# Patient Record
Sex: Female | Born: 1935 | Race: White | Hispanic: No | Smoking: Never smoker
Health system: Southern US, Community
[De-identification: ages and names within clinical notes are randomized; demographics above are authoritative.]

## PROBLEM LIST (undated history)

## (undated) DIAGNOSIS — M1990 Unspecified osteoarthritis, unspecified site: Secondary | ICD-10-CM

## (undated) DIAGNOSIS — D126 Benign neoplasm of colon, unspecified: Secondary | ICD-10-CM

## (undated) DIAGNOSIS — C4491 Basal cell carcinoma of skin, unspecified: Secondary | ICD-10-CM

## (undated) DIAGNOSIS — T7840XA Allergy, unspecified, initial encounter: Secondary | ICD-10-CM

## (undated) DIAGNOSIS — E785 Hyperlipidemia, unspecified: Secondary | ICD-10-CM

## (undated) HISTORY — DX: Unspecified osteoarthritis, unspecified site: M19.90

## (undated) HISTORY — PX: APPENDECTOMY: SHX54

## (undated) HISTORY — DX: Hyperlipidemia, unspecified: E78.5

## (undated) HISTORY — DX: Allergy, unspecified, initial encounter: T78.40XA

## (undated) HISTORY — DX: Basal cell carcinoma of skin, unspecified: C44.91

## (undated) HISTORY — DX: Benign neoplasm of colon, unspecified: D12.6

---

## 2005-06-28 ENCOUNTER — Ambulatory Visit: Payer: Self-pay | Admitting: Internal Medicine

## 2005-07-09 ENCOUNTER — Ambulatory Visit: Payer: Self-pay | Admitting: Internal Medicine

## 2005-08-01 ENCOUNTER — Encounter: Admission: RE | Admit: 2005-08-01 | Discharge: 2005-08-01 | Payer: Self-pay | Admitting: Internal Medicine

## 2005-09-25 ENCOUNTER — Ambulatory Visit: Payer: Self-pay | Admitting: Internal Medicine

## 2006-07-04 ENCOUNTER — Other Ambulatory Visit: Admission: RE | Admit: 2006-07-04 | Discharge: 2006-07-04 | Payer: Self-pay | Admitting: Internal Medicine

## 2006-07-04 ENCOUNTER — Ambulatory Visit: Payer: Self-pay | Admitting: Internal Medicine

## 2006-07-04 ENCOUNTER — Encounter: Payer: Self-pay | Admitting: Internal Medicine

## 2006-08-04 ENCOUNTER — Encounter: Admission: RE | Admit: 2006-08-04 | Discharge: 2006-08-04 | Payer: Self-pay | Admitting: Internal Medicine

## 2006-08-14 ENCOUNTER — Ambulatory Visit: Payer: Self-pay | Admitting: Gastroenterology

## 2006-08-28 ENCOUNTER — Encounter (INDEPENDENT_AMBULATORY_CARE_PROVIDER_SITE_OTHER): Payer: Self-pay | Admitting: Specialist

## 2006-08-28 ENCOUNTER — Ambulatory Visit: Payer: Self-pay | Admitting: Gastroenterology

## 2006-08-28 LAB — HM COLONOSCOPY

## 2006-09-17 ENCOUNTER — Ambulatory Visit: Payer: Self-pay | Admitting: Internal Medicine

## 2007-04-12 LAB — CONVERTED CEMR LAB: Pap Smear: NORMAL

## 2007-07-21 DIAGNOSIS — Z860100 Personal history of colon polyps, unspecified: Secondary | ICD-10-CM | POA: Insufficient documentation

## 2007-07-21 DIAGNOSIS — E785 Hyperlipidemia, unspecified: Secondary | ICD-10-CM | POA: Insufficient documentation

## 2007-08-06 ENCOUNTER — Encounter: Admission: RE | Admit: 2007-08-06 | Discharge: 2007-08-06 | Payer: Self-pay | Admitting: Internal Medicine

## 2007-08-19 ENCOUNTER — Ambulatory Visit: Payer: Self-pay | Admitting: Internal Medicine

## 2007-08-21 LAB — CONVERTED CEMR LAB
ALT: 22 units/L (ref 0–35)
AST: 24 units/L (ref 0–37)
Albumin: 4.1 g/dL (ref 3.5–5.2)
Alkaline Phosphatase: 79 units/L (ref 39–117)
BUN: 19 mg/dL (ref 6–23)
Basophils Absolute: 0 10*3/uL (ref 0.0–0.1)
Basophils Relative: 0.5 % (ref 0.0–1.0)
Bilirubin, Direct: 0.1 mg/dL (ref 0.0–0.3)
CO2: 33 meq/L — ABNORMAL HIGH (ref 19–32)
Calcium: 9.8 mg/dL (ref 8.4–10.5)
Chloride: 107 meq/L (ref 96–112)
Cholesterol: 208 mg/dL (ref 0–200)
Creatinine, Ser: 0.7 mg/dL (ref 0.4–1.2)
Direct LDL: 139.1 mg/dL
Eosinophils Absolute: 0.3 10*3/uL (ref 0.0–0.6)
Eosinophils Relative: 6.2 % — ABNORMAL HIGH (ref 0.0–5.0)
GFR calc Af Amer: 107 mL/min
GFR calc non Af Amer: 88 mL/min
Glucose, Bld: 112 mg/dL — ABNORMAL HIGH (ref 70–99)
HCT: 41.4 % (ref 36.0–46.0)
HDL: 48.2 mg/dL (ref >39.0)
Hemoglobin: 14 g/dL (ref 12.0–15.0)
Lymphocytes Relative: 45 % (ref 12.0–46.0)
MCHC: 33.8 g/dL (ref 30.0–36.0)
MCV: 90.7 fL (ref 78.0–100.0)
Monocytes Absolute: 0.5 10*3/uL (ref 0.2–0.7)
Monocytes Relative: 8.7 % (ref 3.0–11.0)
Neutro Abs: 2.2 10*3/uL (ref 1.4–7.7)
Neutrophils Relative %: 39.6 % — ABNORMAL LOW (ref 43.0–77.0)
Platelets: 260 10*3/uL (ref 150–400)
Potassium: 5.3 meq/L — ABNORMAL HIGH (ref 3.5–5.1)
RBC: 4.57 M/uL (ref 3.87–5.11)
RDW: 12.7 % (ref 11.5–14.6)
Sodium: 145 meq/L (ref 135–145)
TSH: 2.61 microintl units/mL (ref 0.35–5.50)
Total Bilirubin: 0.8 mg/dL (ref 0.3–1.2)
Total CHOL/HDL Ratio: 4.3
Total Protein: 7.6 g/dL (ref 6.0–8.3)
Triglycerides: 145 mg/dL (ref 0–149)
VLDL: 29 mg/dL (ref 0–40)
WBC: 5.5 10*3/uL (ref 4.5–10.5)

## 2007-10-15 ENCOUNTER — Telehealth: Payer: Self-pay | Admitting: Internal Medicine

## 2007-10-15 ENCOUNTER — Ambulatory Visit: Payer: Self-pay | Admitting: Internal Medicine

## 2007-12-14 ENCOUNTER — Ambulatory Visit: Payer: Self-pay | Admitting: Internal Medicine

## 2007-12-14 LAB — CONVERTED CEMR LAB
BUN: 21 mg/dL (ref 6–23)
CO2: 31 meq/L (ref 19–32)
GFR calc Af Amer: 107 mL/min
Hgb A1c MFr Bld: 6.2 % — ABNORMAL HIGH (ref 4.6–6.0)
Potassium: 4.6 meq/L (ref 3.5–5.1)
Total CHOL/HDL Ratio: 4
Triglycerides: 70 mg/dL (ref 0–149)
VLDL: 14 mg/dL (ref 0–40)

## 2007-12-21 ENCOUNTER — Ambulatory Visit: Payer: Self-pay | Admitting: Internal Medicine

## 2007-12-21 DIAGNOSIS — R7309 Other abnormal glucose: Secondary | ICD-10-CM | POA: Insufficient documentation

## 2007-12-28 ENCOUNTER — Encounter: Payer: Self-pay | Admitting: Internal Medicine

## 2007-12-28 ENCOUNTER — Ambulatory Visit: Payer: Self-pay | Admitting: Family Medicine

## 2008-05-18 ENCOUNTER — Ambulatory Visit: Payer: Self-pay | Admitting: Internal Medicine

## 2008-05-18 ENCOUNTER — Telehealth: Payer: Self-pay | Admitting: Internal Medicine

## 2008-05-18 LAB — CONVERTED CEMR LAB
Nitrite: POSITIVE
Specific Gravity, Urine: 1.02
Urobilinogen, UA: 1

## 2008-06-13 ENCOUNTER — Ambulatory Visit: Payer: Self-pay | Admitting: Internal Medicine

## 2008-06-13 LAB — CONVERTED CEMR LAB
ALT: 18 units/L (ref 0–35)
AST: 24 units/L (ref 0–37)
BUN: 24 mg/dL — ABNORMAL HIGH (ref 6–23)
Bilirubin, Direct: 0.1 mg/dL (ref 0.0–0.3)
CO2: 34 meq/L — ABNORMAL HIGH (ref 19–32)
Calcium: 9.5 mg/dL (ref 8.4–10.5)
GFR calc Af Amer: 107 mL/min
GFR calc non Af Amer: 88 mL/min
HDL: 40.7 mg/dL (ref >39.0)
LDL Cholesterol: 95 mg/dL (ref 0–99)
Sodium: 143 meq/L (ref 135–145)
Total Bilirubin: 0.8 mg/dL (ref 0.3–1.2)
Total CHOL/HDL Ratio: 3.7
Triglycerides: 74 mg/dL (ref 0–149)

## 2008-06-16 ENCOUNTER — Ambulatory Visit: Payer: Self-pay | Admitting: Internal Medicine

## 2008-08-08 ENCOUNTER — Encounter: Admission: RE | Admit: 2008-08-08 | Discharge: 2008-08-08 | Payer: Self-pay | Admitting: Internal Medicine

## 2008-08-12 ENCOUNTER — Ambulatory Visit: Payer: Self-pay | Admitting: Internal Medicine

## 2008-12-21 ENCOUNTER — Ambulatory Visit: Payer: Self-pay | Admitting: Internal Medicine

## 2008-12-21 LAB — CONVERTED CEMR LAB
Albumin: 3.9 g/dL (ref 3.5–5.2)
BUN: 21 mg/dL (ref 6–23)
Calcium: 9.3 mg/dL (ref 8.4–10.5)
Cholesterol: 148 mg/dL (ref 0–200)
Creatinine, Ser: 0.6 mg/dL (ref 0.4–1.2)
GFR calc Af Amer: 127 mL/min
GFR calc non Af Amer: 105 mL/min
Glucose, Bld: 96 mg/dL (ref 70–99)
HDL: 45.7 mg/dL (ref >39.0)
Hgb A1c MFr Bld: 6.2 % — ABNORMAL HIGH (ref 4.6–6.0)
LDL Cholesterol: 93 mg/dL (ref 0–99)
Total Protein: 7.5 g/dL (ref 6.0–8.3)
Triglycerides: 49 mg/dL (ref 0–149)
VLDL: 10 mg/dL (ref 0–40)

## 2008-12-28 ENCOUNTER — Ambulatory Visit: Payer: Self-pay | Admitting: Internal Medicine

## 2009-06-16 ENCOUNTER — Ambulatory Visit: Payer: Self-pay | Admitting: Family Medicine

## 2009-06-16 LAB — CONVERTED CEMR LAB
Bilirubin Urine: NEGATIVE
Ketones, urine, test strip: NEGATIVE
Specific Gravity, Urine: 1.02

## 2009-06-17 ENCOUNTER — Encounter: Payer: Self-pay | Admitting: Internal Medicine

## 2009-06-27 ENCOUNTER — Ambulatory Visit: Payer: Self-pay | Admitting: Internal Medicine

## 2009-06-27 LAB — CONVERTED CEMR LAB
AST: 19 units/L (ref 0–37)
Albumin: 3.9 g/dL (ref 3.5–5.2)
Alkaline Phosphatase: 62 units/L (ref 39–117)
BUN: 24 mg/dL — ABNORMAL HIGH (ref 6–23)
Bilirubin, Direct: 0 mg/dL (ref 0.0–0.3)
CO2: 34 meq/L — ABNORMAL HIGH (ref 19–32)
Calcium: 9.2 mg/dL (ref 8.4–10.5)
Chloride: 107 meq/L (ref 96–112)
Cholesterol: 149 mg/dL (ref 0–200)
Creatinine, Ser: 0.7 mg/dL (ref 0.4–1.2)
LDL Cholesterol: 87 mg/dL (ref 0–99)
Total CHOL/HDL Ratio: 3
Triglycerides: 69 mg/dL (ref 0.0–149.0)

## 2009-07-04 ENCOUNTER — Ambulatory Visit: Payer: Self-pay | Admitting: Internal Medicine

## 2009-08-11 ENCOUNTER — Encounter: Admission: RE | Admit: 2009-08-11 | Discharge: 2009-08-11 | Payer: Self-pay | Admitting: Internal Medicine

## 2009-08-17 ENCOUNTER — Ambulatory Visit: Payer: Self-pay | Admitting: Internal Medicine

## 2010-01-09 ENCOUNTER — Ambulatory Visit: Payer: Self-pay | Admitting: Internal Medicine

## 2010-01-09 LAB — CONVERTED CEMR LAB
ALT: 15 units/L (ref 0–35)
AST: 22 units/L (ref 0–37)
Alkaline Phosphatase: 57 units/L (ref 39–117)
Bilirubin, Direct: 0.1 mg/dL (ref 0.0–0.3)
Chloride: 109 meq/L (ref 96–112)
Cholesterol: 153 mg/dL (ref 0–200)
GFR calc non Af Amer: 87.54 mL/min (ref >60)
Hgb A1c MFr Bld: 6.3 % (ref 4.6–6.5)
LDL Cholesterol: 85 mg/dL (ref 0–99)
Potassium: 5.5 meq/L — ABNORMAL HIGH (ref 3.5–5.1)
Sodium: 144 meq/L (ref 135–145)
Total Bilirubin: 0.6 mg/dL (ref 0.3–1.2)
Total CHOL/HDL Ratio: 3
VLDL: 9.8 mg/dL (ref 0.0–40.0)

## 2010-01-18 ENCOUNTER — Ambulatory Visit: Payer: Self-pay | Admitting: Internal Medicine

## 2010-03-13 ENCOUNTER — Ambulatory Visit: Payer: Self-pay | Admitting: Internal Medicine

## 2010-07-18 ENCOUNTER — Ambulatory Visit: Payer: Self-pay | Admitting: Internal Medicine

## 2010-07-18 LAB — CONVERTED CEMR LAB
ALT: 14 units/L (ref 0–35)
Bilirubin, Direct: 0.1 mg/dL (ref 0.0–0.3)
Chloride: 105 meq/L (ref 96–112)
GFR calc non Af Amer: 117.94 mL/min (ref >60)
Glucose, Bld: 90 mg/dL (ref 70–99)
HDL: 52.7 mg/dL (ref >39.00)
LDL Cholesterol: 109 mg/dL — ABNORMAL HIGH (ref 0–99)
Potassium: 4.2 meq/L (ref 3.5–5.1)
Sodium: 143 meq/L (ref 135–145)
Total Bilirubin: 0.6 mg/dL (ref 0.3–1.2)
Total CHOL/HDL Ratio: 3
VLDL: 13.8 mg/dL (ref 0.0–40.0)

## 2010-07-23 ENCOUNTER — Ambulatory Visit: Payer: Self-pay | Admitting: Internal Medicine

## 2010-08-14 ENCOUNTER — Encounter: Admission: RE | Admit: 2010-08-14 | Discharge: 2010-08-14 | Payer: Self-pay | Admitting: Internal Medicine

## 2010-08-14 LAB — HM MAMMOGRAPHY

## 2010-11-06 ENCOUNTER — Telehealth: Payer: Self-pay | Admitting: Internal Medicine

## 2010-12-11 NOTE — Miscellaneous (Signed)
Summary: BONE DENSITY  Clinical Lists Changes  Orders: Added new Test order of T-Bone Densitometry (77080) - Signed Added new Test order of T-Lumbar Vertebral Assessment (77082) - Signed 

## 2010-12-11 NOTE — Assessment & Plan Note (Signed)
Summary: 6 MNTH ROV//SLM/PT RESCD FROM BUMP//CCM   Vital Signs:  Patient profile:   75 year old female Weight:      144 pounds Temp:     98.2 degrees F oral Pulse rate:   62 / minute Resp:     16 per minute BP sitting:   166 / 60  Vitals Entered By: Lynann Beaver CMA (January 18, 2010 3:00 PM)  Serial Vital Signs/Assessments:  Time      Position  BP       Pulse  Resp  Temp     By                     138/82                         Birdie Sons MD  CC: rov Is Patient Diabetic? No Pain Assessment Patient in pain? no        CC:  rov.  History of Present Illness: ? bp home BPS 120s/60s no sxs tolerating meds without difficulty  lipids --- here for f/u, tolerating meds, no complications  All other systems reviewed and were negative     Current Medications (verified): 1)  Lipitor 20 Mg Tabs (Atorvastatin Calcium) .... Once Daily 2)  Caltrate 600+d 600-400 Mg-Unit  Tabs (Calcium Carbonate-Vitamin D) .... Two Times A Day 3)  Aspirin 81 Mg  Tbec (Aspirin) .... Once Daily  Allergies (verified): No Known Drug Allergies  Past History:  Past Medical History: Last updated: 07/21/2007 Colonic polyps, hx of Hyperlipidemia Allergies High Cholesterol UTIs  Past Surgical History: Last updated: 07/21/2007 Appendectomy  Family History: Last updated: 12/28/2008 Family History Diabetes 1st degree relative-father Family History of Cardiovascular disorder Father MI at age 44 mother-throat CA  Social History: Last updated: 07/21/2007 Occupation: Single Never Smoked Alcohol use-no Drug use-no Regular exercise-yes  Risk Factors: Exercise: yes (07/21/2007)  Risk Factors: Smoking Status: never (07/21/2007)  Physical Exam  General:  Well-developed,well-nourished,in no acute distress; alert,appropriate and cooperative throughout examination Head:  normocephalic and atraumatic.   Neck:  No deformities, masses, or tenderness noted. Chest Wall:  No deformities,  masses, or tenderness noted. Lungs:  Normal respiratory effort, chest expands symmetrically. Lungs are clear to auscultation, no crackles or wheezes. Heart:  Normal rate and regular rhythm. S1 and S2 normal without gallop, murmur, click, rub or other extra sounds. Abdomen:  soft and non-tender.     Impression & Recommendations:  Problem # 1:  HYPERLIPIDEMIA (ICD-272.4) controlled continue current medications  Her updated medication list for this problem includes:    Lipitor 20 Mg Tabs (Atorvastatin calcium) ..... Once daily  Labs Reviewed: SGOT: 22 (01/09/2010)   SGPT: 15 (01/09/2010)   HDL:58.00 (01/09/2010), 48.40 (06/27/2009)  LDL:85 (01/09/2010), 87 (06/27/2009)  Chol:153 (01/09/2010), 149 (06/27/2009)  Trig:49.0 (01/09/2010), 69.0 (06/27/2009) she will monitor bp at home see serial assessment  Complete Medication List: 1)  Lipitor 20 Mg Tabs (Atorvastatin calcium) .... Once daily 2)  Caltrate 600+d 600-400 Mg-unit Tabs (Calcium carbonate-vitamin d) .... Two times a day 3)  Aspirin 81 Mg Tbec (Aspirin) .... Once daily  Patient Instructions: 1)  Please schedule a follow-up appointment in 6 months. 2)  labs one week prior to visit 3)  lipids---272.4 4)  lfts-995.2 5)  bmet-995.2 6)  A1C-250.02 7)

## 2010-12-11 NOTE — Assessment & Plan Note (Signed)
Summary: 6 MTH ROV, F/U ON LABWORK // RS   Vital Signs:  Patient profile:   75 year old female Height:      63.5 inches Weight:      144 pounds BMI:     25.20 Temp:     98.4 degrees F oral BP sitting:   120 / 80  (left arm) Cuff size:   regular  Vitals Entered By: Kern Reap CMA Duncan Dull) (July 23, 2010 10:18 AM) CC: follow-up visit   CC:  follow-up visit.  History of Present Illness:  Follow-Up Visit      This is a 75 year old woman who presents for Follow-up visit.  The patient denies chest pain and palpitations.  Since the last visit the patient notes no new problems or concerns.  The patient reports taking meds as prescribed.  When questioned about possible medication side effects, the patient notes none.    All other systems reviewed and were negative   Current Problems (verified): 1)  Hyperglycemia  (ICD-790.29) 2)  Preventive Health Care  (ICD-V70.0) 3)  Family History Diabetes 1st Degree Relative  (ICD-V18.0) 4)  Hyperlipidemia  (ICD-272.4) 5)  Colonic Polyps, Hx of  (ICD-V12.72)  Current Medications (verified): 1)  Lipitor 20 Mg Tabs (Atorvastatin Calcium) .... Once Daily 2)  Caltrate 600+d 600-400 Mg-Unit  Tabs (Calcium Carbonate-Vitamin D) .... Two Times A Day 3)  Aspirin 81 Mg  Tbec (Aspirin) .... Once Daily  Allergies (verified): No Known Drug Allergies  Review of Systems       Flu Vaccine Consent Questions     Do you have a history of severe allergic reactions to this vaccine? no    Any prior history of allergic reactions to egg and/or gelatin? no    Do you have a sensitivity to the preservative Thimersol? no    Do you have a past history of Guillan-Barre Syndrome? no    Do you currently have an acute febrile illness? no    Have you ever had a severe reaction to latex? no    Vaccine information given and explained to patient? yes    Are you currently pregnant? no    Lot Number:[id number]   Exp Date:05/11/2011   Site Given  Left Deltoid  IM   Physical Exam  General:  alert and well-developed.   Head:  normocephalic and atraumatic.   Neck:  No deformities, masses, or tenderness noted. Heart:  normal rate and regular rhythm.   Abdomen:  soft and non-tender.   Skin:  turgor normal and color normal.   Psych:  normally interactive and good eye contact.     Impression & Recommendations:  Problem # 1:  HYPERGLYCEMIA (ICD-790.29) reviewed labs encouraged her to exercise daily and eat a low fat low calorie diet In my opinion she is at high risk of developing DM if she were to gain any weight  Problem # 2:  HYPERLIPIDEMIA (ICD-272.4)  Her updated medication list for this problem includes:    Lipitor 20 Mg Tabs (Atorvastatin calcium) ..... Once daily  Labs Reviewed: SGOT: 22 (07/18/2010)   SGPT: 14 (07/18/2010)   HDL:52.70 (07/18/2010), 58.00 (01/09/2010)  LDL:109 (07/18/2010), 85 (04/54/0981)  Chol:175 (07/18/2010), 153 (01/09/2010)  Trig:69.0 (07/18/2010), 49.0 (01/09/2010)  Complete Medication List: 1)  Lipitor 20 Mg Tabs (Atorvastatin calcium) .... Once daily 2)  Caltrate 600+d 600-400 Mg-unit Tabs (Calcium carbonate-vitamin d) .... Two times a day 3)  Aspirin 81 Mg Tbec (Aspirin) .... Once daily  Other Orders: Admin 1st  Vaccine (98119) Flu Vaccine 66yrs + (14782)  Patient Instructions: 1)  Please schedule a follow-up appointment in 6 months. 2)  labs one week prior to visit 3)  lipids---272.4 4)  lfts-995.2 5)  bmet-995.2 6)  A1C-250.02 7)

## 2010-12-13 NOTE — Progress Notes (Signed)
Summary: Pt req to change to generic Lipitor instead of brand.   Phone Note Call from Patient Call back at [facility] Phone [phone]   Caller: Patient Summary of Call: Pt is req to have Lipitor 20mg   changed to generic. Pls call generic in to [facility] on [address]    Initial call taken by: Lucy Antigua,  November 06, 2010 1:54 PM  Follow-up for Phone Call        l/m on machine to ask the pharmacist to refill generic.  If she has any problems to call me back Follow-up by: Alfred Levins, CMA,  November 06, 2010 2:04 PM

## 2011-01-14 ENCOUNTER — Other Ambulatory Visit (INDEPENDENT_AMBULATORY_CARE_PROVIDER_SITE_OTHER): Payer: Medicare Other | Admitting: Internal Medicine

## 2011-01-14 DIAGNOSIS — E785 Hyperlipidemia, unspecified: Secondary | ICD-10-CM

## 2011-01-14 DIAGNOSIS — T887XXA Unspecified adverse effect of drug or medicament, initial encounter: Secondary | ICD-10-CM

## 2011-01-14 DIAGNOSIS — I10 Essential (primary) hypertension: Secondary | ICD-10-CM

## 2011-01-14 DIAGNOSIS — E119 Type 2 diabetes mellitus without complications: Secondary | ICD-10-CM

## 2011-01-14 LAB — HEPATIC FUNCTION PANEL
ALT: 14 U/L (ref 0–35)
AST: 21 U/L (ref 0–37)
Bilirubin, Direct: 0.1 mg/dL (ref 0.0–0.3)
Total Bilirubin: 0.4 mg/dL (ref 0.3–1.2)
Total Protein: 7.2 g/dL (ref 6.0–8.3)

## 2011-01-14 LAB — BASIC METABOLIC PANEL
BUN: 21 mg/dL (ref 6–23)
CO2: 29 mEq/L (ref 19–32)
Chloride: 102 mEq/L (ref 96–112)
Potassium: 4.7 mEq/L (ref 3.5–5.1)

## 2011-01-14 LAB — LIPID PANEL
LDL Cholesterol: 91 mg/dL (ref 0–99)
VLDL: 14.4 mg/dL (ref 0.0–40.0)

## 2011-01-14 LAB — HEMOGLOBIN A1C: Hgb A1c MFr Bld: 6.7 % — ABNORMAL HIGH (ref 4.6–6.5)

## 2011-01-21 ENCOUNTER — Ambulatory Visit: Payer: Self-pay | Admitting: Internal Medicine

## 2011-02-04 ENCOUNTER — Ambulatory Visit: Payer: Self-pay | Admitting: Internal Medicine

## 2011-02-08 ENCOUNTER — Encounter: Payer: Self-pay | Admitting: Internal Medicine

## 2011-02-11 ENCOUNTER — Encounter: Payer: Self-pay | Admitting: Internal Medicine

## 2011-02-11 ENCOUNTER — Ambulatory Visit (INDEPENDENT_AMBULATORY_CARE_PROVIDER_SITE_OTHER): Payer: Medicare Other | Admitting: Internal Medicine

## 2011-02-11 DIAGNOSIS — E785 Hyperlipidemia, unspecified: Secondary | ICD-10-CM

## 2011-02-11 DIAGNOSIS — R7309 Other abnormal glucose: Secondary | ICD-10-CM

## 2011-02-11 NOTE — Progress Notes (Signed)
  Subjective:    Patient ID: Gabriela Gomez, female    DOB: 1936/01/21, 75 y.o.   MRN: [id number]  HPI  Pt has a hx of hyperlipidemia---tolerating atorvastatin without difficulty.  Hyperglycemia---may need f/u lab work  New complaint: 4 month hx of intermittent left knee swelling. Minimal pain at times but has not interfered with her very active lifestyle. She walks regularly  Past Medical History  Diagnosis Date  . Hyperlipidemia   . Allergy   . Colon polyps    Past Surgical History  Procedure Date  . Appendectomy     reports that she has never smoked. She does not have any smokeless tobacco history on file. She reports that she does not drink alcohol or use illicit drugs. family history includes Cancer in her mother; Diabetes in her father; Heart attack in her father; and Heart disease in her father. No Known Allergies   Review of Systems  patient denies chest pain, shortness of breath, orthopnea. Denies lower extremity edema, abdominal pain, change in appetite, change in bowel movements. Patient denies rashes, musculoskeletal complaints. No other specific complaints in a complete review of systems.      Objective:   Physical Exam  Well-developed well-nourished female in no acute distress. HEENT exam atraumatic, normocephalic, extraocular muscles are intact. Neck is supple. No jugular venous distention no thyromegaly. Chest clear to auscultation without increased work of breathing. Cardiac exam S1 and S2 are regular.  Extremities no edema. Neurologic exam she is alert without any motor sensory deficits. Gait is normal. She has a small joint effusion on the left knee. No erythema or pain to palpation.        Assessment & Plan:  Knee effusion. I suspect this is osteoarthritis. I don't think any further evaluation is necessary at this time as it's not affecting any of her activities of daily living. She will continue to be active and walk regularly.

## 2011-02-11 NOTE — Assessment & Plan Note (Signed)
Well controlled. Continue current medications  

## 2011-02-11 NOTE — Assessment & Plan Note (Signed)
Note mild elevation A1c compared to previous results. She admits to a more carbohydrate rich diet over the past several months. I've advised her to increase walking, moderate diet. She should follow a low calorie diet. He should concentrate on some weight loss. Family history reviewed. No family history of diabetes. This puts her some increased risk of developing diabetes. Note her blood pressure. I rechecked her blood pressure 135/85. She reports to me that her home blood pressures are more in the range of 120s over 60s. No further treatment at this time.

## 2011-03-03 ENCOUNTER — Ambulatory Visit (INDEPENDENT_AMBULATORY_CARE_PROVIDER_SITE_OTHER): Payer: Medicare Other

## 2011-03-03 ENCOUNTER — Inpatient Hospital Stay (INDEPENDENT_AMBULATORY_CARE_PROVIDER_SITE_OTHER)
Admission: RE | Admit: 2011-03-03 | Discharge: 2011-03-03 | Disposition: A | Discharge status: Home / Self Care | Payer: Medicare Other | Source: Ambulatory Visit | Attending: Family Medicine | Admitting: Family Medicine

## 2011-03-03 DIAGNOSIS — S62639A Displaced fracture of distal phalanx of unspecified finger, initial encounter for closed fracture: Secondary | ICD-10-CM

## 2011-03-03 DIAGNOSIS — T148XXA Other injury of unspecified body region, initial encounter: Secondary | ICD-10-CM

## 2011-06-06 ENCOUNTER — Other Ambulatory Visit: Payer: Medicare Other

## 2011-06-13 ENCOUNTER — Ambulatory Visit: Payer: Medicare Other | Admitting: Internal Medicine

## 2011-07-25 ENCOUNTER — Other Ambulatory Visit (INDEPENDENT_AMBULATORY_CARE_PROVIDER_SITE_OTHER): Payer: Medicare Other

## 2011-07-25 DIAGNOSIS — T887XXA Unspecified adverse effect of drug or medicament, initial encounter: Secondary | ICD-10-CM

## 2011-07-25 DIAGNOSIS — E119 Type 2 diabetes mellitus without complications: Secondary | ICD-10-CM

## 2011-07-25 DIAGNOSIS — E785 Hyperlipidemia, unspecified: Secondary | ICD-10-CM

## 2011-07-25 LAB — HEPATIC FUNCTION PANEL
AST: 24 U/L (ref 0–37)
Albumin: 4 g/dL (ref 3.5–5.2)
Alkaline Phosphatase: 67 U/L (ref 39–117)
Bilirubin, Direct: 0.1 mg/dL (ref 0.0–0.3)
Total Bilirubin: 0.6 mg/dL (ref 0.3–1.2)

## 2011-07-25 LAB — BASIC METABOLIC PANEL
Calcium: 9 mg/dL (ref 8.4–10.5)
Creatinine, Ser: 0.6 mg/dL (ref 0.4–1.2)
GFR: 102.17 mL/min (ref >60.00)

## 2011-07-25 LAB — LIPID PANEL
Total CHOL/HDL Ratio: 3
Triglycerides: 55 mg/dL (ref 0.0–149.0)

## 2011-08-01 ENCOUNTER — Ambulatory Visit (INDEPENDENT_AMBULATORY_CARE_PROVIDER_SITE_OTHER): Payer: Medicare Other | Admitting: Internal Medicine

## 2011-08-01 ENCOUNTER — Encounter: Payer: Self-pay | Admitting: Internal Medicine

## 2011-08-01 ENCOUNTER — Ambulatory Visit (INDEPENDENT_AMBULATORY_CARE_PROVIDER_SITE_OTHER)
Admission: RE | Admit: 2011-08-01 | Discharge: 2011-08-01 | Disposition: A | Discharge status: Home / Self Care | Payer: Medicare Other | Source: Ambulatory Visit | Attending: Internal Medicine | Admitting: Internal Medicine

## 2011-08-01 DIAGNOSIS — M25462 Effusion, left knee: Secondary | ICD-10-CM

## 2011-08-01 DIAGNOSIS — Z23 Encounter for immunization: Secondary | ICD-10-CM

## 2011-08-01 DIAGNOSIS — M25469 Effusion, unspecified knee: Secondary | ICD-10-CM

## 2011-08-01 DIAGNOSIS — R7309 Other abnormal glucose: Secondary | ICD-10-CM

## 2011-08-01 DIAGNOSIS — E785 Hyperlipidemia, unspecified: Secondary | ICD-10-CM

## 2011-08-01 NOTE — Assessment & Plan Note (Signed)
Well controlled Continue meds 

## 2011-08-01 NOTE — Assessment & Plan Note (Signed)
Wt Readings from Last 3 Encounters:  08/01/11 143 lb (64.864 kg)  02/11/11 144 lb (65.318 kg)  07/23/10 144 lb (65.318 kg)    Lab Results  Component Value Date   HGBA1C 6.5 07/25/2011  controlled Will check in 6 months

## 2011-08-01 NOTE — Patient Instructions (Signed)
Ask your insurance company about shingles immunization

## 2011-08-11 NOTE — Progress Notes (Signed)
  Subjective:    Patient ID: Gabriela Gomez, female    DOB: 1936-06-09, 75 y.o.   MRN: [id number]  HPI Patient comes in for evaluation. She has a history of hyperglycemia and hyperlipidemia. She is tolerating medications without difficulty. No specific complaints.  Past Medical History  Diagnosis Date  . Hyperlipidemia   . Allergy   . Colon polyps    Past Surgical History  Procedure Date  . Appendectomy     reports that she has never smoked. She does not have any smokeless tobacco history on file. She reports that she does not drink alcohol or use illicit drugs. family history includes Cancer in her mother; Diabetes in her father; Heart attack in her father; and Heart disease in her father. No Known Allergies    Review of Systems  patient denies chest pain, shortness of breath, orthopnea. Denies lower extremity edema, abdominal pain, change in appetite, change in bowel movements. Patient denies rashes, musculoskeletal complaints. No other specific complaints in a complete review of systems.      Objective:   Physical Exam  Well-developed female in no acute distress. Neck is supple. Chest is clear to auscultation cardiac exam S1-S2 are regular. Extremities no edema      Assessment & Plan:

## 2011-08-15 ENCOUNTER — Other Ambulatory Visit: Payer: Self-pay | Admitting: Internal Medicine

## 2011-08-15 DIAGNOSIS — Z1231 Encounter for screening mammogram for malignant neoplasm of breast: Secondary | ICD-10-CM

## 2011-08-16 ENCOUNTER — Ambulatory Visit (INDEPENDENT_AMBULATORY_CARE_PROVIDER_SITE_OTHER): Payer: Medicare Other

## 2011-08-16 DIAGNOSIS — Z23 Encounter for immunization: Secondary | ICD-10-CM

## 2011-08-30 ENCOUNTER — Ambulatory Visit
Admission: RE | Admit: 2011-08-30 | Discharge: 2011-08-30 | Disposition: A | Discharge status: Home / Self Care | Payer: Medicare Other | Source: Ambulatory Visit | Attending: Internal Medicine | Admitting: Internal Medicine

## 2011-08-30 DIAGNOSIS — Z1231 Encounter for screening mammogram for malignant neoplasm of breast: Secondary | ICD-10-CM

## 2011-09-02 ENCOUNTER — Telehealth: Payer: Self-pay | Admitting: Internal Medicine

## 2011-09-02 NOTE — Telephone Encounter (Signed)
Pt called req to get script for shingles vax so she can get vax done at a pharmacy. Pt is aware that shingles vax is on back order at the present time. Pt has checked will her insurance and it will cover vax.

## 2011-09-03 MED ORDER — ZOSTER VACCINE LIVE 19400 UNT/0.65ML ~~LOC~~ SOLR: 0.6500 mL | Freq: Once | SUBCUTANEOUS | Status: DC

## 2011-09-03 NOTE — Telephone Encounter (Signed)
[facility] rx sent in electronically

## 2011-09-20 ENCOUNTER — Encounter: Payer: Self-pay | Admitting: Gastroenterology

## 2011-10-11 ENCOUNTER — Encounter: Payer: Self-pay | Admitting: Gastroenterology

## 2011-10-21 ENCOUNTER — Other Ambulatory Visit: Payer: Self-pay | Admitting: Internal Medicine

## 2011-11-07 ENCOUNTER — Ambulatory Visit (AMBULATORY_SURGERY_CENTER): Payer: Medicare Other | Admitting: *Deleted

## 2011-11-07 VITALS — Ht 63.0 in | Wt 145.0 lb

## 2011-11-07 DIAGNOSIS — Z1211 Encounter for screening for malignant neoplasm of colon: Secondary | ICD-10-CM

## 2011-11-07 MED ORDER — PEG-KCL-NACL-NASULF-NA ASC-C 100 G PO SOLR: ORAL | Status: DC

## 2011-11-08 ENCOUNTER — Encounter: Payer: Self-pay | Admitting: Gastroenterology

## 2011-11-21 ENCOUNTER — Ambulatory Visit (AMBULATORY_SURGERY_CENTER): Payer: Medicare Other | Admitting: Gastroenterology

## 2011-11-21 ENCOUNTER — Encounter: Payer: Self-pay | Admitting: Gastroenterology

## 2011-11-21 VITALS — BP 137/63 | HR 66 | Temp 95.6°F | Resp 14 | Ht 63.0 in | Wt 145.0 lb

## 2011-11-21 DIAGNOSIS — Z86010 Personal history of colonic polyps: Secondary | ICD-10-CM

## 2011-11-21 DIAGNOSIS — Z1211 Encounter for screening for malignant neoplasm of colon: Secondary | ICD-10-CM

## 2011-11-21 MED ORDER — SODIUM CHLORIDE 0.9 % IV SOLN: 500.0000 mL | INTRAVENOUS | Status: DC

## 2011-11-21 NOTE — Op Note (Signed)
[facility] [address] [place], [place]  [address]  COLONOSCOPY PROCEDURE REPORT  PATIENT:  Gabriela Gomez, Gabriela Gomez  MR#:  [id number] BIRTHDATE:  1936-06-04, 75 yrs. old  GENDER:  female ENDOSCOPIST:  Judie Petit T. Russella Dar, MD, [facility]  PROCEDURE DATE:  11/21/2011 PROCEDURE:  Colonoscopy 19147 ASA CLASS:  Class II INDICATIONS:  1) surveillance and high-risk screening  2) history of pre-cancerous (adenomatous) colon polyps MEDICATIONS:   These medications were titrated to patient response per physician's verbal order, Fentanyl 100 mcg IV, Versed 10 mg IV DESCRIPTION OF PROCEDURE:   After the risks benefits and alternatives of the procedure were thoroughly explained, informed consent was obtained.  Digital rectal exam was performed and revealed no abnormalities.   The LB 180AL [id number] endoscope was introduced through the anus and advanced to the cecum, which was identified by both the appendix and ileocecal valve, limited by a tortuous colon.    The quality of the prep was excellent, using MoviPrep.  The instrument was then slowly withdrawn as the colon was fully examined. <<PROCEDUREIMAGES>> FINDINGS:  Mild diverticulosis was found in the sigmoid colon. Otherwise normal colonoscopy without other polyps, masses, vascular ectasias, or inflammatory changes.   Retroflexed views in the rectum revealed no abnormalities.  The time to cecum =  9.75 minutes. The scope was then withdrawn (time =  10.5  min) from the patient and the procedure completed.  COMPLICATIONS:  None  ENDOSCOPIC IMPRESSION: 1) Mild diverticulosis in the sigmoid colon  RECOMMENDATIONS: 1) High fiber diet with liberal fluid intake. 2) Given no precancerous polyps on the last 3 colonoscopies and your age, you will not need another colonoscopy for colon cancer screening/polyp surveillance. These types of tests usually stop around the age 30. 3) Consider MAC sedation for future procedures  Giovani Neumeister T. Russella Dar, MD,  Clementeen Graham  n. eSIGNED:   Venita Lick. Eloise Mula at 11/21/2011 11:02 AM  [place], [phone]

## 2011-11-21 NOTE — Progress Notes (Signed)
Patient did not experience any of the following events: a burn prior to discharge; a fall within the facility; wrong site/side/patient/procedure/implant event; or a hospital transfer or hospital admission upon discharge from the facility. (G8907) Patient did not have preoperative order for IV antibiotic SSI prophylaxis. (G8918)  

## 2011-11-22 ENCOUNTER — Telehealth: Payer: Self-pay | Admitting: *Deleted

## 2011-11-22 NOTE — Telephone Encounter (Signed)
Patient had mentioned that it was ok for [place] to leave a message at home while she was in the procedure room with me yesterday.   I left a message for the patient to call us if necessary.

## 2012-01-29 ENCOUNTER — Other Ambulatory Visit (INDEPENDENT_AMBULATORY_CARE_PROVIDER_SITE_OTHER): Payer: Medicare Other

## 2012-01-29 DIAGNOSIS — E785 Hyperlipidemia, unspecified: Secondary | ICD-10-CM

## 2012-01-29 DIAGNOSIS — R7309 Other abnormal glucose: Secondary | ICD-10-CM

## 2012-01-29 LAB — BASIC METABOLIC PANEL
BUN: 22 mg/dL (ref 6–23)
CO2: 29 mEq/L (ref 19–32)
Chloride: 107 mEq/L (ref 96–112)
Glucose, Bld: 97 mg/dL (ref 70–99)
Potassium: 4.8 mEq/L (ref 3.5–5.1)

## 2012-01-29 LAB — LIPID PANEL
LDL Cholesterol: 80 mg/dL (ref 0–99)
Total CHOL/HDL Ratio: 3
VLDL: 12.4 mg/dL (ref 0.0–40.0)

## 2012-01-29 LAB — HEPATIC FUNCTION PANEL
Alkaline Phosphatase: 58 U/L (ref 39–117)
Bilirubin, Direct: 0 mg/dL (ref 0.0–0.3)
Total Bilirubin: 0.3 mg/dL (ref 0.3–1.2)
Total Protein: 7.1 g/dL (ref 6.0–8.3)

## 2012-02-05 ENCOUNTER — Ambulatory Visit: Payer: Medicare Other | Admitting: Internal Medicine

## 2012-02-11 ENCOUNTER — Ambulatory Visit (INDEPENDENT_AMBULATORY_CARE_PROVIDER_SITE_OTHER): Payer: Medicare Other | Admitting: Internal Medicine

## 2012-02-11 ENCOUNTER — Encounter: Payer: Self-pay | Admitting: Internal Medicine

## 2012-02-11 VITALS — BP 140/80 | HR 68 | Temp 98.1°F | Wt 144.0 lb

## 2012-02-11 DIAGNOSIS — E785 Hyperlipidemia, unspecified: Secondary | ICD-10-CM

## 2012-02-11 DIAGNOSIS — Z23 Encounter for immunization: Secondary | ICD-10-CM

## 2012-02-11 DIAGNOSIS — R7309 Other abnormal glucose: Secondary | ICD-10-CM

## 2012-02-11 NOTE — Assessment & Plan Note (Signed)
a1c controlled Discussed need for normal weight : bmi<25

## 2012-02-11 NOTE — Assessment & Plan Note (Signed)
Controlled Continue meds 

## 2012-02-11 NOTE — Progress Notes (Signed)
Patient ID: Gabriela Gomez, female   DOB: Jan 11, 1936, 76 y.o.   MRN: [id number] hyperlycemia- no home bps. She tries to stay active and eat a healthy diet  Lipids: here for f/u  Past Medical History  Diagnosis Date  . Hyperlipidemia   . Allergy   . Arthritis   . Adenomatous colon polyp     History   Social History  . Marital Status: Widowed    Spouse Name: N/A    Number of Children: N/A  . Years of Education: N/A   Occupational History  . Not on file.   Social History Main Topics  . Smoking status: Never Smoker   . Smokeless tobacco: Never Used  . Alcohol Use: No  . Drug Use: No  . Sexually Active: Not on file   Other Topics Concern  . Not on file   Social History Narrative  . No narrative on file    Past Surgical History  Procedure Date  . Appendectomy     Family History  Problem Relation Age of Onset  . Cancer Mother     throat  . Diabetes Father   . Heart disease Father   . Heart attack Father     No Known Allergies  Current Outpatient Prescriptions on File Prior to Visit  Medication Sig Dispense Refill  . aspirin 81 MG tablet Take 81 mg by mouth daily.        Marland Kitchen atorvastatin (LIPITOR) 20 MG tablet TAKE 1 TABLET EVERY DAY  90 tablet  3  . Calcium Carbonate-Vit D-Min (CALTRATE 600+D PLUS) 600-400 MG-UNIT per tablet Take 1 tablet by mouth daily.        . multivitamin (THERAGRAN) per tablet Take 1 tablet by mouth daily.         patient denies chest pain, shortness of breath, orthopnea. Denies lower extremity edema, abdominal pain, change in appetite, change in bowel movements. Patient denies rashes, musculoskeletal complaints. No other specific complaints in a complete review of systems.   BP 140/80  Pulse 68  Temp(Src) 98.1 F (36.7 C) (Oral)  Wt 144 lb (65.318 kg)  Well-developed well-nourished female in no acute distress. HEENT exam atraumatic, normocephalic, extraocular muscles are intact. Neck is supple. No jugular venous distention no thyromegaly.  Chest clear to auscultation without increased work of breathing. Cardiac exam S1 and S2 are regular.

## 2012-08-12 ENCOUNTER — Ambulatory Visit: Payer: Medicare Other | Admitting: Internal Medicine

## 2012-08-14 ENCOUNTER — Ambulatory Visit (INDEPENDENT_AMBULATORY_CARE_PROVIDER_SITE_OTHER): Payer: Medicare Other | Admitting: Internal Medicine

## 2012-08-14 ENCOUNTER — Encounter: Payer: Self-pay | Admitting: Internal Medicine

## 2012-08-14 VITALS — BP 138/77 | HR 64 | Temp 98.2°F | Wt 143.0 lb

## 2012-08-14 DIAGNOSIS — E785 Hyperlipidemia, unspecified: Secondary | ICD-10-CM

## 2012-08-14 DIAGNOSIS — R7309 Other abnormal glucose: Secondary | ICD-10-CM

## 2012-08-14 DIAGNOSIS — Z23 Encounter for immunization: Secondary | ICD-10-CM

## 2012-08-14 LAB — HEMOGLOBIN A1C: Hgb A1c MFr Bld: 6.5 % (ref 4.6–6.5)

## 2012-08-14 NOTE — Assessment & Plan Note (Signed)
Needs to check labs today

## 2012-08-14 NOTE — Progress Notes (Signed)
Patient ID: Gabriela Gomez, female   DOB: May 10, 1936, 76 y.o.   MRN: [id number]  BP- home bps 120-130s/70s Lipids- tolerating meds Hyperglycemia-- not on any meds. She exercises regularly (daily)  Past Medical History  Diagnosis Date  . Hyperlipidemia   . Allergy   . Arthritis   . Adenomatous colon polyp     History   Social History  . Marital Status: Widowed    Spouse Name: N/A    Number of Children: N/A  . Years of Education: N/A   Occupational History  . Not on file.   Social History Main Topics  . Smoking status: Never Smoker   . Smokeless tobacco: Never Used  . Alcohol Use: No  . Drug Use: No  . Sexually Active: Not on file   Other Topics Concern  . Not on file   Social History Narrative  . No narrative on file    Past Surgical History  Procedure Date  . Appendectomy     Family History  Problem Relation Age of Onset  . Cancer Mother     throat  . Diabetes Father   . Heart disease Father   . Heart attack Father     No Known Allergies  Current Outpatient Prescriptions on File Prior to Visit  Medication Sig Dispense Refill  . aspirin 81 MG tablet Take 81 mg by mouth daily.        Marland Kitchen atorvastatin (LIPITOR) 20 MG tablet TAKE 1 TABLET EVERY DAY  90 tablet  3  . Calcium Carbonate-Vit D-Min (CALTRATE 600+D PLUS) 600-400 MG-UNIT per tablet Take 1 tablet by mouth daily.        . multivitamin (THERAGRAN) per tablet Take 1 tablet by mouth daily.         patient denies chest pain, shortness of breath, orthopnea. Denies lower extremity edema, abdominal pain, change in appetite, change in bowel movements. Patient denies rashes, musculoskeletal complaints. No other specific complaints in a complete review of systems.   BP 160/74  Pulse 64  Temp 98.2 F (36.8 C) (Oral)  Wt 143 lb (64.864 kg)  SpO2 98%  Well-developed well-nourished female in no acute distress. HEENT exam atraumatic, normocephalic, extraocular muscles are intact. Neck is supple. No jugular venous  distention no thyromegaly. Chest clear to auscultation without increased work of breathing. Cardiac exam S1 and S2 are regular. Abdominal exam active bowel sounds, soft, nontender. Extremities no edema.

## 2012-08-14 NOTE — Assessment & Plan Note (Signed)
Check a1c She is taking great care of herself from an exercise perspective

## 2012-08-15 ENCOUNTER — Encounter: Payer: Self-pay | Admitting: Internal Medicine

## 2012-08-21 ENCOUNTER — Other Ambulatory Visit: Payer: Self-pay | Admitting: Internal Medicine

## 2012-08-21 DIAGNOSIS — Z1231 Encounter for screening mammogram for malignant neoplasm of breast: Secondary | ICD-10-CM

## 2012-09-02 ENCOUNTER — Ambulatory Visit
Admission: RE | Admit: 2012-09-02 | Discharge: 2012-09-02 | Disposition: A | Discharge status: Home / Self Care | Payer: Medicare Other | Source: Ambulatory Visit | Attending: Internal Medicine | Admitting: Internal Medicine

## 2012-09-02 DIAGNOSIS — Z1231 Encounter for screening mammogram for malignant neoplasm of breast: Secondary | ICD-10-CM

## 2012-10-19 ENCOUNTER — Other Ambulatory Visit: Payer: Self-pay | Admitting: Internal Medicine

## 2013-02-12 ENCOUNTER — Ambulatory Visit (INDEPENDENT_AMBULATORY_CARE_PROVIDER_SITE_OTHER): Payer: Medicare Other | Admitting: Internal Medicine

## 2013-02-12 ENCOUNTER — Encounter: Payer: Self-pay | Admitting: Internal Medicine

## 2013-02-12 VITALS — BP 142/74 | HR 72 | Temp 98.4°F | Wt 143.0 lb

## 2013-02-12 DIAGNOSIS — E785 Hyperlipidemia, unspecified: Secondary | ICD-10-CM

## 2013-02-12 DIAGNOSIS — R7309 Other abnormal glucose: Secondary | ICD-10-CM

## 2013-02-12 LAB — LIPID PANEL
Cholesterol: 141 mg/dL (ref 0–200)
HDL: 49.7 mg/dL (ref >39.00)
LDL Cholesterol: 73 mg/dL (ref 0–99)
VLDL: 18.4 mg/dL (ref 0.0–40.0)

## 2013-02-12 LAB — HEPATIC FUNCTION PANEL
ALT: 20 U/L (ref 0–35)
Albumin: 3.9 g/dL (ref 3.5–5.2)
Total Protein: 7.4 g/dL (ref 6.0–8.3)

## 2013-02-12 LAB — HEMOGLOBIN A1C: Hgb A1c MFr Bld: 6.5 % (ref 4.6–6.5)

## 2013-02-12 LAB — TSH: TSH: 2.15 u[IU]/mL (ref 0.35–5.50)

## 2013-02-12 LAB — BASIC METABOLIC PANEL
Calcium: 8.9 mg/dL (ref 8.4–10.5)
GFR: 103.69 mL/min (ref >60.00)
Potassium: 4 mEq/L (ref 3.5–5.1)
Sodium: 139 mEq/L (ref 135–145)

## 2013-02-12 NOTE — Assessment & Plan Note (Signed)
Tolerating medications without difficulty. Will check laboratory work today.

## 2013-02-12 NOTE — Progress Notes (Signed)
Patient comes in for hyperlipidemia, hyperglycemia. She needs followup. She is tolerating medications without difficulty.  Reviewed past medical history, medications, social history.   patient denies chest pain, shortness of breath, orthopnea. Denies lower extremity edema, abdominal pain, change in appetite, change in bowel movements. Patient denies rashes, musculoskeletal complaints. No other specific complaints in a complete review of systems.   Well-developed female in no acute distress. Vital signs reviewed. HEENT exam atraumatic, normocephalic, neck supple. Chest clear auscultation cardiac exam S1-S2 are regular. Abdominal exam active bowel sounds, soft. Extremities no edema.

## 2013-02-12 NOTE — Assessment & Plan Note (Signed)
History of hyperglycemia. We'll check basic metabolic profile today. We'll check A1c.

## 2013-02-19 ENCOUNTER — Other Ambulatory Visit: Payer: Self-pay | Admitting: Internal Medicine

## 2013-02-19 ENCOUNTER — Telehealth: Payer: Self-pay | Admitting: Internal Medicine

## 2013-02-19 DIAGNOSIS — E2839 Other primary ovarian failure: Secondary | ICD-10-CM

## 2013-02-19 DIAGNOSIS — Z780 Asymptomatic menopausal state: Secondary | ICD-10-CM

## 2013-02-19 NOTE — Telephone Encounter (Signed)
lmom for pt to call back

## 2013-02-19 NOTE — Telephone Encounter (Signed)
Message copied by Leafy Ro on Fri Feb 19, 2013  5:01 PM ------      Message from: Alfred Levins D      Created: Fri Feb 19, 2013 11:31 AM       Nelva Bush, will you call pt and schedule bone density for me?  Thank you!            ----- Message -----         From: Lindley Magnus, MD         Sent: 02/18/2013   9:40 PM           To: Lavada Mesi, CMA            Schedule DEXA scan      ----- Message -----         From: Drue Stager         Sent: 02/17/2013  10:29 AM           To: Lindley Magnus, MD            This one is scanned in EPIC too            ----- Message -----         From: Lindley Magnus, MD         Sent: 02/12/2013   9:09 AM           To: Drue Stager            elam to scan last DEXA scan                   ------

## 2013-02-23 NOTE — Telephone Encounter (Signed)
Pt is out of town now. Pt daughter will notify pt to call office ask for norma

## 2013-03-11 ENCOUNTER — Ambulatory Visit (INDEPENDENT_AMBULATORY_CARE_PROVIDER_SITE_OTHER)
Admission: RE | Admit: 2013-03-11 | Discharge: 2013-03-11 | Disposition: A | Discharge status: Home / Self Care | Payer: Medicare Other | Source: Ambulatory Visit | Attending: Internal Medicine | Admitting: Internal Medicine

## 2013-03-11 DIAGNOSIS — Z780 Asymptomatic menopausal state: Secondary | ICD-10-CM

## 2013-03-11 DIAGNOSIS — E2839 Other primary ovarian failure: Secondary | ICD-10-CM

## 2013-08-18 ENCOUNTER — Ambulatory Visit (INDEPENDENT_AMBULATORY_CARE_PROVIDER_SITE_OTHER): Payer: Medicare Other

## 2013-08-18 DIAGNOSIS — Z23 Encounter for immunization: Secondary | ICD-10-CM

## 2013-08-31 ENCOUNTER — Other Ambulatory Visit: Payer: Self-pay

## 2013-08-31 DIAGNOSIS — Z1231 Encounter for screening mammogram for malignant neoplasm of breast: Secondary | ICD-10-CM

## 2013-09-28 ENCOUNTER — Ambulatory Visit
Admission: RE | Admit: 2013-09-28 | Discharge: 2013-09-28 | Disposition: A | Discharge status: Home / Self Care | Payer: Medicare Other | Source: Ambulatory Visit

## 2013-09-28 DIAGNOSIS — Z1231 Encounter for screening mammogram for malignant neoplasm of breast: Secondary | ICD-10-CM

## 2013-11-06 ENCOUNTER — Other Ambulatory Visit: Payer: Self-pay | Admitting: Internal Medicine

## 2014-02-14 ENCOUNTER — Encounter: Payer: Self-pay | Admitting: Internal Medicine

## 2014-02-14 ENCOUNTER — Ambulatory Visit (INDEPENDENT_AMBULATORY_CARE_PROVIDER_SITE_OTHER): Payer: Medicare Other | Admitting: Internal Medicine

## 2014-02-14 VITALS — BP 138/84 | HR 67 | Temp 98.6°F | Ht 63.0 in | Wt 136.0 lb

## 2014-02-14 DIAGNOSIS — R7309 Other abnormal glucose: Secondary | ICD-10-CM

## 2014-02-14 DIAGNOSIS — E785 Hyperlipidemia, unspecified: Secondary | ICD-10-CM

## 2014-02-14 LAB — BASIC METABOLIC PANEL
BUN: 22 mg/dL (ref 6–23)
CALCIUM: 9.2 mg/dL (ref 8.4–10.5)
CO2: 29 meq/L (ref 19–32)
CREATININE: 0.6 mg/dL (ref 0.4–1.2)
Chloride: 106 mEq/L (ref 96–112)
GFR: 99.57 mL/min (ref >60.00)
Glucose, Bld: 91 mg/dL (ref 70–99)
Potassium: 3.9 mEq/L (ref 3.5–5.1)
Sodium: 141 mEq/L (ref 135–145)

## 2014-02-14 LAB — HEPATIC FUNCTION PANEL
ALT: 15 U/L (ref 0–35)
AST: 21 U/L (ref 0–37)
Albumin: 4 g/dL (ref 3.5–5.2)
Alkaline Phosphatase: 59 U/L (ref 39–117)
BILIRUBIN DIRECT: 0 mg/dL (ref 0.0–0.3)
BILIRUBIN TOTAL: 0.4 mg/dL (ref 0.3–1.2)
TOTAL PROTEIN: 7.6 g/dL (ref 6.0–8.3)

## 2014-02-14 LAB — LIPID PANEL
CHOL/HDL RATIO: 3
Cholesterol: 165 mg/dL (ref 0–200)
HDL: 56.7 mg/dL (ref >39.00)
LDL Cholesterol: 88 mg/dL (ref 0–99)
TRIGLYCERIDES: 103 mg/dL (ref 0.0–149.0)
VLDL: 20.6 mg/dL (ref 0.0–40.0)

## 2014-02-14 LAB — HEMOGLOBIN A1C: Hgb A1c MFr Bld: 6.5 % (ref 4.6–6.5)

## 2014-02-14 NOTE — Assessment & Plan Note (Signed)
Will check labs today including a1c

## 2014-02-14 NOTE — Progress Notes (Signed)
Patient here for f/u  She has hyperlipidemia and tolerates atorvastatin without difficulty  She has hyperglycemia and needs f/u  Past Medical History  Diagnosis Date  . Hyperlipidemia   . Allergy   . Arthritis   . Adenomatous colon polyp     History   Social History  . Marital Status: Widowed    Spouse Name: N/A    Number of Children: N/A  . Years of Education: N/A   Occupational History  . Not on file.   Social History Main Topics  . Smoking status: Never Smoker   . Smokeless tobacco: Never Used  . Alcohol Use: No  . Drug Use: No  . Sexual Activity: Not on file   Other Topics Concern  . Not on file   Social History Narrative  . No narrative on file      Family History  Problem Relation Age of Onset  . Cancer Mother     throat  . Diabetes Father   . Heart disease Father   . Heart attack Father     No Known Allergies  Current Outpatient Prescriptions on File Prior to Visit  Medication Sig Dispense Refill  . aspirin 81 MG tablet Take 81 mg by mouth daily.        Marland Kitchen atorvastatin (LIPITOR) 20 MG tablet TAKE 1 TABLET BY MOUTH EVERY DAY  90 tablet  0  . Calcium Carbonate-Vit D-Min (CALTRATE 600+D PLUS) 600-400 MG-UNIT per tablet Take 1 tablet by mouth daily.        . multivitamin (THERAGRAN) per tablet Take 1 tablet by mouth daily.       No current facility-administered medications on file prior to visit.     patient denies chest pain, shortness of breath, orthopnea. Denies lower extremity edema, abdominal pain, change in appetite, change in bowel movements. Patient denies rashes, musculoskeletal complaints. No other specific complaints in a complete review of systems.   Reviewed vitals   Well-developed well-nourished female in no acute distress. HEENT exam atraumatic, normocephalic, extraocular muscles are intact. Neck is supple. No jugular venous distention no thyromegaly. Chest clear to auscultation without increased work of breathing. Cardiac exam S1 and  S2 are regular. Abdominal exam active bowel sounds, soft, nontender. Extremities no edema. Neurologic exam she is alert without any motor sensory deficits. Gait is normal.  HYPERGLYCEMIA Will check labs today including a1c  HYPERLIPIDEMIA Will check labs today including liver panel    .

## 2014-02-14 NOTE — Assessment & Plan Note (Signed)
Will check labs today including liver panel

## 2014-02-17 ENCOUNTER — Other Ambulatory Visit: Payer: Self-pay | Admitting: Internal Medicine

## 2014-05-09 ENCOUNTER — Ambulatory Visit (INDEPENDENT_AMBULATORY_CARE_PROVIDER_SITE_OTHER): Payer: Medicare Other | Admitting: Physician Assistant

## 2014-05-09 ENCOUNTER — Encounter: Payer: Self-pay | Admitting: Physician Assistant

## 2014-05-09 VITALS — BP 124/70 | HR 72 | Temp 98.2°F | Resp 18 | Wt 135.0 lb

## 2014-05-09 DIAGNOSIS — R300 Dysuria: Secondary | ICD-10-CM

## 2014-05-09 DIAGNOSIS — N390 Urinary tract infection, site not specified: Secondary | ICD-10-CM

## 2014-05-09 LAB — POCT URINALYSIS DIPSTICK
Bilirubin, UA: NEGATIVE
GLUCOSE UA: NEGATIVE
Nitrite, UA: NEGATIVE
Spec Grav, UA: 1.025
Urobilinogen, UA: 0.2
pH, UA: 6.5

## 2014-05-09 MED ORDER — CEFUROXIME AXETIL 250 MG PO TABS: 250.0000 mg | ORAL_TABLET | Freq: Two times a day (BID) | ORAL | Status: DC

## 2014-05-09 MED ORDER — CEFUROXIME AXETIL 500 MG PO TABS: 500.0000 mg | ORAL_TABLET | Freq: Two times a day (BID) | ORAL | Status: DC

## 2014-05-09 NOTE — Patient Instructions (Addendum)
Ceftin 250 mg twice a day for 7 days to treat UTI.  We will call with the urine culture results when available.  Continue to monitor your symptoms for improvement on antibiotic treatment.  If emergency symptoms discussed during visit developed, seek medical attention immediately.  Followup as needed, or for worsening or persistent symptoms despite treatment.   Urinary Tract Infection A urinary tract infection (UTI) can occur any place along the urinary tract. The tract includes the kidneys, ureters, bladder, and urethra. A type of germ called bacteria often causes a UTI. UTIs are often helped with antibiotic medicine.  HOME CARE   If given, take antibiotics as told by your doctor. Finish them even if you start to feel better.  Drink enough fluids to keep your pee (urine) clear or pale yellow.  Avoid tea, drinks with caffeine, and bubbly (carbonated) drinks.  Pee often. Avoid holding your pee in for a long time.  Pee before and after having sex (intercourse).  Wipe from front to back after you poop (bowel movement) if you are a woman. Use each tissue only once. GET HELP RIGHT AWAY IF:   You have back pain.  You have lower belly (abdominal) pain.  You have chills.  You feel sick to your stomach (nauseous).  You throw up (vomit).  Your burning or discomfort with peeing does not go away.  You have a fever.  Your symptoms are not better in 3 days. MAKE SURE YOU:   Understand these instructions.  Will watch your condition.  Will get help right away if you are not doing well or get worse. Document Released: 04/15/2008 Document Revised: 07/22/2012 Document Reviewed: 05/28/2012 [facility] Patient Information 2015 [place], [place]. This information is not intended to replace advice given to you by your health care provider. Make sure you discuss any questions you have with your health care provider.

## 2014-05-09 NOTE — Progress Notes (Signed)
Pre visit review using our clinic review tool, if applicable. No additional management support is needed unless otherwise documented below in the visit note. 

## 2014-05-09 NOTE — Progress Notes (Signed)
Subjective:    Patient ID: Gabriela Gomez, female    DOB: May 21, 1936, 78 y.o.   MRN: [id number]  Dysuria  This is a new problem. The current episode started yesterday. The problem occurs every urination. The problem has been unchanged. The quality of the pain is described as burning (Similar to UTIs she had in past.). There has been no fever. Associated symptoms include frequency, hematuria and urgency. Pertinent negatives include no chills, discharge, flank pain, hesitancy, nausea, possible pregnancy, sweats or vomiting. She has tried nothing (Has decreased fluid intake.) for the symptoms. The treatment provided no relief. There is no history of catheterization or recurrent UTIs.      Review of Systems  Constitutional: Negative for fever and chills.  Respiratory: Negative for shortness of breath.   Gastrointestinal: Negative for nausea, vomiting and diarrhea.  Genitourinary: Positive for dysuria, urgency, frequency and hematuria. Negative for hesitancy and flank pain.  All other systems reviewed and are negative.  Past Medical History  Diagnosis Date  . Hyperlipidemia   . Allergy   . Arthritis   . Adenomatous colon polyp     History   Social History  . Marital Status: Widowed    Spouse Name: N/A    Number of Children: N/A  . Years of Education: N/A   Occupational History  . Not on file.   Social History Main Topics  . Smoking status: Never Smoker   . Smokeless tobacco: Never Used  . Alcohol Use: No  . Drug Use: No  . Sexual Activity: Not on file   Other Topics Concern  . Not on file   Social History Narrative  . No narrative on file    Past Surgical History  Procedure Laterality Date  . Appendectomy      Family History  Problem Relation Age of Onset  . Cancer Mother     throat  . Diabetes Father   . Heart disease Father   . Heart attack Father     No Known Allergies  Current Outpatient Prescriptions on File Prior to Visit  Medication Sig Dispense  Refill  . aspirin 81 MG tablet Take 81 mg by mouth daily.        Marland Kitchen atorvastatin (LIPITOR) 20 MG tablet TAKE 1 TABLET BY MOUTH EVERY DAY  90 tablet  0  . Calcium Carbonate-Vit D-Min (CALTRATE 600+D PLUS) 600-400 MG-UNIT per tablet Take 1 tablet by mouth daily.        . multivitamin (THERAGRAN) per tablet Take 1 tablet by mouth daily.       No current facility-administered medications on file prior to visit.    EXAM: BP 124/70  Pulse 72  Temp(Src) 98.2 F (36.8 C) (Oral)  Resp 18  Wt 135 lb (61.236 kg)     Objective:   Physical Exam  Nursing note and vitals reviewed. Constitutional: She is oriented to person, place, and time. She appears well-developed and well-nourished. No distress.  HENT:  Head: Normocephalic and atraumatic.  Eyes: Conjunctivae and EOM are normal. Pupils are equal, round, and reactive to light.  Neck: Normal range of motion.  Cardiovascular: Normal rate, regular rhythm and intact distal pulses.   Pulmonary/Chest: Effort normal and breath sounds normal. No respiratory distress. She exhibits no tenderness.  No CVA tenderness.  Musculoskeletal: Normal range of motion.  Neurological: She is alert and oriented to person, place, and time.  Skin: Skin is warm and dry. No rash noted. She is not diaphoretic. No erythema. No pallor.  Psychiatric: She has a normal mood and affect. Her behavior is normal. Judgment and thought content normal.     Lab Results  Component Value Date   WBC 5.5 08/19/2007   HGB 14.0 08/19/2007   HCT 41.4 08/19/2007   PLT 260 08/19/2007   GLUCOSE 91 02/14/2014   CHOL 165 02/14/2014   TRIG 103.0 02/14/2014   HDL 56.70 02/14/2014   LDLDIRECT 139.1 08/19/2007   LDLCALC 88 02/14/2014   ALT 15 02/14/2014   AST 21 02/14/2014   NA 141 02/14/2014   K 3.9 02/14/2014   CL 106 02/14/2014   CREATININE 0.6 02/14/2014   BUN 22 02/14/2014   CO2 29 02/14/2014   TSH 2.15 02/12/2013   HGBA1C 6.5 02/14/2014    Urinalysis    Component Value Date/Time   COLORURINE yellow  06/16/2009 1144   APPEARANCEUR Cloudy 06/16/2009 1144   LABSPEC 1.020 06/16/2009 1144   PHURINE 7.0 06/16/2009 1144   HGBUR 2+ 06/16/2009 1144   BILIRUBINUR n 05/09/2014 1025   BILIRUBINUR negative 06/16/2009 1144   PROTEINUR 3+ 05/09/2014 1025   UROBILINOGEN 0.2 05/09/2014 1025   UROBILINOGEN 0.2 06/16/2009 1144   NITRITE n 05/09/2014 1025   NITRITE negative 06/16/2009 1144   LEUKOCYTESUR small (1+) 05/09/2014 1025      Assessment & Plan:  Ambur was seen today for dysuria.  Diagnoses and associated orders for this visit:  Dysuria - POCT urinalysis dipstick - Culture, Urine - cefUROXime (CEFTIN) 250 MG tablet; Take 1 tablet (250 mg total) by mouth 2 (two) times daily with a meal.  Urinary tract infection, site not specified Comments: Will obtain culture. Also with blood, likely hemorrhagic cystitis. Pt will monitor for resolution of this and other symptoms with treatment. Rx Ceftin 7 days. - cefUROXime (CEFTIN) 250 MG tablet; Take 1 tablet (250 mg total) by mouth 2 (two) times daily with a meal.    Pt will continue to monitor symptoms at home for resolution with treatment.  Return precautions provided, and patient handout on UTIs.  Plan to follow up as needed, or for worsening or persistent symptoms despite treatment.  Patient Instructions  Ceftin 250 mg twice a day for 7 days to treat UTI.  We will call with the urine culture results when available.  Continue to monitor your symptoms for improvement on antibiotic treatment.  If emergency symptoms discussed during visit developed, seek medical attention immediately.  Followup as needed, or for worsening or persistent symptoms despite treatment.

## 2014-05-11 LAB — URINE CULTURE: Colony Count: 70000

## 2014-05-27 ENCOUNTER — Other Ambulatory Visit: Payer: Self-pay | Admitting: Internal Medicine

## 2014-08-25 ENCOUNTER — Ambulatory Visit (INDEPENDENT_AMBULATORY_CARE_PROVIDER_SITE_OTHER): Payer: Medicare Other

## 2014-08-25 DIAGNOSIS — Z23 Encounter for immunization: Secondary | ICD-10-CM

## 2014-09-06 ENCOUNTER — Other Ambulatory Visit: Payer: Self-pay | Admitting: Internal Medicine

## 2014-09-29 ENCOUNTER — Other Ambulatory Visit: Payer: Self-pay

## 2014-09-29 DIAGNOSIS — Z1231 Encounter for screening mammogram for malignant neoplasm of breast: Secondary | ICD-10-CM

## 2014-10-10 ENCOUNTER — Ambulatory Visit
Admission: RE | Admit: 2014-10-10 | Discharge: 2014-10-10 | Disposition: A | Discharge status: Home / Self Care | Payer: Medicare Other | Source: Ambulatory Visit

## 2014-10-10 DIAGNOSIS — Z1231 Encounter for screening mammogram for malignant neoplasm of breast: Secondary | ICD-10-CM

## 2014-12-01 ENCOUNTER — Other Ambulatory Visit: Payer: Self-pay | Admitting: Internal Medicine

## 2015-03-01 ENCOUNTER — Other Ambulatory Visit: Payer: Self-pay | Admitting: Internal Medicine

## 2015-03-13 ENCOUNTER — Ambulatory Visit (INDEPENDENT_AMBULATORY_CARE_PROVIDER_SITE_OTHER): Payer: Medicare Other | Admitting: Adult Health

## 2015-03-13 ENCOUNTER — Encounter: Payer: Self-pay | Admitting: Adult Health

## 2015-03-13 VITALS — BP 130/80 | Temp 98.4°F | Ht 63.0 in | Wt 134.9 lb

## 2015-03-13 DIAGNOSIS — Z23 Encounter for immunization: Secondary | ICD-10-CM | POA: Diagnosis not present

## 2015-03-13 DIAGNOSIS — E785 Hyperlipidemia, unspecified: Secondary | ICD-10-CM | POA: Diagnosis not present

## 2015-03-13 DIAGNOSIS — Z0000 Encounter for general adult medical examination without abnormal findings: Secondary | ICD-10-CM

## 2015-03-13 NOTE — Addendum Note (Signed)
Addended by: Colleen Can on: 03/13/2015 11:51 AM   Modules accepted: Orders

## 2015-03-13 NOTE — Progress Notes (Signed)
HPI:  Gabriela Gomez is here to establish care.  Last PCP and physical:02/2014 with MD Swords Last Eye Exam: May 2015 Dentis: Needs to schedule  Mammograms: Yearly  No hospital visits since last physical  She is a very pleasant and healthy 79 year old non smoking white female. She has no complaints at this time. She exercises three days a week and maintains a healthy diet and weight.    Has the following chronic problems that require follow up and concerns today:  Hyperlipidemia  Is currently on Lipitor and has no problems. She was inquiring about getting off Lipitor.      ROS negative for unless reported above: fevers, unintentional weight loss, hearing or vision loss, chest pain, palpitations, struggling to breath, hemoptysis, melena, hematochezia, hematuria, falls, loc, si, thoughts of self harm  Past Medical History  Diagnosis Date  . Hyperlipidemia   . Allergy   . Arthritis   . Adenomatous colon polyp     Past Surgical History  Procedure Laterality Date  . Appendectomy      Family History  Problem Relation Age of Onset  . Cancer Mother     throat  . Diabetes Father   . Heart disease Father   . Heart attack Father     History   Social History  . Marital Status: Widowed    Spouse Name: N/A  . Number of Children: N/A  . Years of Education: N/A   Social History Main Topics  . Smoking status: Never Smoker   . Smokeless tobacco: Never Used  . Alcohol Use: No  . Drug Use: No  . Sexual Activity: Not on file   Other Topics Concern  . Not on file   Social History Narrative  . No narrative on file     Current outpatient prescriptions:  .  atorvastatin (LIPITOR) 20 MG tablet, TAKE 1 TABLET BY MOUTH EVERY DAY, Disp: 90 tablet, Rfl: 0 .  multivitamin (THERAGRAN) per tablet, Take 1 tablet by mouth daily., Disp: , Rfl:   EXAM:  Filed Vitals:   03/13/15 1000  Temp: 98.4 F (36.9 C)    Body mass index is 23.9 kg/(m^2).  GENERAL: vitals reviewed and  listed above, alert, oriented, appears well hydrated and in no acute distress.  HEENT: atraumatic, conjunttiva clear, no obvious abnormalities on inspection of external nose and ears  NECK: no obvious masses on inspection  LUNGS: clear to auscultation bilaterally, no wheezes, rales or rhonchi, good air movement  CV: HRRR, no peripheral edema, no carotid bruit.   MS: moves all extremities without noticeable abnormality. No edema noted  Abd: soft/nontender/nondistended/normal bowel sounds   Skin: warm and dry, no rash   Neuro: CN II-XII intact, sensation and reflexes normal throughout, 5/5 muscle strength in bilateral upper and lower extremities. Normal finger to nose. Normal rapid alternating movements. No pronator drift.   PSYCH: pleasant and cooperative, no obvious depression or anxiety  ASSESSMENT AND PLAN:  Discussed the following assessment and plan: 1. Health care maintenance - Basic metabolic panel; Future - CBC; Future - TSH; Future - Urinalysis, Routine w reflex microscopic; Future - Lipid panel; Future - Hepatic function panel; Future - Hemoglobin A1c; Future - Will follow up with patient once labs have resulted.  - Follow up in one year or as needed.   2. Hyperlipidemia - Will wait until lipid panel results, if good we can trial a discontinue of lipitor since patient has a healthy life style and low cardiac risk.  We reviewed the PMH, PSH, FH, SH, Meds and Allergies. -We provided refills for any medications we will prescribe as needed. -We addressed current concerns per orders and patient instructions. -We have advised patient to follow up per instructions below.   -Patient advised to return or notify a provider immediately if symptoms worsen or persist or new concerns arise.  There are no Patient Instructions on file for this visit.   Dorothyann Peng, AGNP

## 2015-03-13 NOTE — Patient Instructions (Signed)
I have sent a refill for Lipitor to the pharmacy. Please continue to exercise and eat a healthy diet. It was a pleasure meeting you and I am happy that you are establishing care with me. Please do not hesitate to contact me if you need anything. Health Maintenance Adopting a healthy lifestyle and getting preventive care can go a long way to promote health and wellness. Talk with your health care provider about what schedule of regular examinations is right for you. This is a good chance for you to check in with your provider about disease prevention and staying healthy. In between checkups, there are plenty of things you can do on your own. Experts have done a lot of research about which lifestyle changes and preventive measures are most likely to keep you healthy. Ask your health care provider for more information. WEIGHT AND DIET  Eat a healthy diet  Be sure to include plenty of vegetables, fruits, low-fat dairy products, and lean protein.  Do not eat a lot of foods high in solid fats, added sugars, or salt.  Get regular exercise. This is one of the most important things you can do for your health.  Most adults should exercise for at least 150 minutes each week. The exercise should increase your heart rate and make you sweat (moderate-intensity exercise).  Most adults should also do strengthening exercises at least twice a week. This is in addition to the moderate-intensity exercise.  Maintain a healthy weight  Body mass index (BMI) is a measurement that can be used to identify possible weight problems. It estimates body fat based on height and weight. Your health care provider can help determine your BMI and help you achieve or maintain a healthy weight.  For females 24 years of age and older:   A BMI below 18.5 is considered underweight.  A BMI of 18.5 to 24.9 is normal.  A BMI of 25 to 29.9 is considered overweight.  A BMI of 30 and above is considered obese.  Watch levels of  cholesterol and blood lipids  You should start having your blood tested for lipids and cholesterol at 77 years of age, then have this test every 5 years.  You may need to have your cholesterol levels checked more often if:  Your lipid or cholesterol levels are high.  You are older than 77 years of age.  You are at high risk for heart disease.  CANCER SCREENING   Lung Cancer  Lung cancer screening is recommended for adults 33-30 years old who are at high risk for lung cancer because of a history of smoking.  A yearly low-dose CT scan of the lungs is recommended for people who:  Currently smoke.  Have quit within the past 15 years.  Have at least a 30-pack-year history of smoking. A pack year is smoking an average of one pack of cigarettes a day for 1 year.  Yearly screening should continue until it has been 15 years since you quit.  Yearly screening should stop if you develop a health problem that would prevent you from having lung cancer treatment.  Breast Cancer  Practice breast self-awareness. This means understanding how your breasts normally appear and feel.  It also means doing regular breast self-exams. Let your health care provider know about any changes, no matter how small.  If you are in your 20s or 30s, you should have a clinical breast exam (CBE) by a health care provider every 1-3 years as part of a  regular health exam.  If you are 40 or older, have a CBE every year. Also consider having a breast X-ray (mammogram) every year.  If you have a family history of breast cancer, talk to your health care provider about genetic screening.  If you are at high risk for breast cancer, talk to your health care provider about having an MRI and a mammogram every year.  Breast cancer gene (BRCA) assessment is recommended for women who have family members with BRCA-related cancers. BRCA-related cancers include:  Breast.  Ovarian.  Tubal.  Peritoneal  cancers.  Results of the assessment will determine the need for genetic counseling and BRCA1 and BRCA2 testing. Cervical Cancer Routine pelvic examinations to screen for cervical cancer are no longer recommended for nonpregnant women who are considered low risk for cancer of the pelvic organs (ovaries, uterus, and vagina) and who do not have symptoms. A pelvic examination may be necessary if you have symptoms including those associated with pelvic infections. Ask your health care provider if a screening pelvic exam is right for you.   The Pap test is the screening test for cervical cancer for women who are considered at risk.  If you had a hysterectomy for a problem that was not cancer or a condition that could lead to cancer, then you no longer need Pap tests.  If you are older than 65 years, and you have had normal Pap tests for the past 10 years, you no longer need to have Pap tests.  If you have had past treatment for cervical cancer or a condition that could lead to cancer, you need Pap tests and screening for cancer for at least 20 years after your treatment.  If you no longer get a Pap test, assess your risk factors if they change (such as having a new sexual partner). This can affect whether you should start being screened again.  Some women have medical problems that increase their chance of getting cervical cancer. If this is the case for you, your health care provider may recommend more frequent screening and Pap tests.  The human papillomavirus (HPV) test is another test that may be used for cervical cancer screening. The HPV test looks for the virus that can cause cell changes in the cervix. The cells collected during the Pap test can be tested for HPV.  The HPV test can be used to screen women 49 years of age and older. Getting tested for HPV can extend the interval between normal Pap tests from three to five years.  An HPV test also should be used to screen women of any age who  have unclear Pap test results.  After 77 years of age, women should have HPV testing as often as Pap tests.  Colorectal Cancer  This type of cancer can be detected and often prevented.  Routine colorectal cancer screening usually begins at 77 years of age and continues through 77 years of age.  Your health care provider may recommend screening at an earlier age if you have risk factors for colon cancer.  Your health care provider may also recommend using home test kits to check for hidden blood in the stool.  A small camera at the end of a tube can be used to examine your colon directly (sigmoidoscopy or colonoscopy). This is done to check for the earliest forms of colorectal cancer.  Routine screening usually begins at age 66.  Direct examination of the colon should be repeated every 5-10 years through 77  years of age. However, you may need to be screened more often if early forms of precancerous polyps or small growths are found. Skin Cancer  Check your skin from head to toe regularly.  Tell your health care provider about any new moles or changes in moles, especially if there is a change in a mole's shape or color.  Also tell your health care provider if you have a mole that is larger than the size of a pencil eraser.  Always use sunscreen. Apply sunscreen liberally and repeatedly throughout the day.  Protect yourself by wearing long sleeves, pants, a wide-brimmed hat, and sunglasses whenever you are outside. HEART DISEASE, DIABETES, AND HIGH BLOOD PRESSURE   Have your blood pressure checked at least every 1-2 years. High blood pressure causes heart disease and increases the risk of stroke.  If you are between 70 years and 39 years old, ask your health care provider if you should take aspirin to prevent strokes.  Have regular diabetes screenings. This involves taking a blood sample to check your fasting blood sugar level.  If you are at a normal weight and have a low risk for  diabetes, have this test once every three years after 77 years of age.  If you are overweight and have a high risk for diabetes, consider being tested at a younger age or more often. PREVENTING INFECTION  Hepatitis B  If you have a higher risk for hepatitis B, you should be screened for this virus. You are considered at high risk for hepatitis B if:  You were born in a country where hepatitis B is common. Ask your health care provider which countries are considered high risk.  Your parents were born in a high-risk country, and you have not been immunized against hepatitis B (hepatitis B vaccine).  You have HIV or AIDS.  You use needles to inject street drugs.  You live with someone who has hepatitis B.  You have had sex with someone who has hepatitis B.  You get hemodialysis treatment.  You take certain medicines for conditions, including cancer, organ transplantation, and autoimmune conditions. Hepatitis C  Blood testing is recommended for:  Everyone born from [birth year] through 1963.  Anyone with known risk factors for hepatitis C. Sexually transmitted infections (STIs)  You should be screened for sexually transmitted infections (STIs) including gonorrhea and chlamydia if:  You are sexually active and are younger than 77 years of age.  You are older than 77 years of age and your health care provider tells you that you are at risk for this type of infection.  Your sexual activity has changed since you were last screened and you are at an increased risk for chlamydia or gonorrhea. Ask your health care provider if you are at risk.  If you do not have HIV, but are at risk, it may be recommended that you take a prescription medicine daily to prevent HIV infection. This is called pre-exposure prophylaxis (PrEP). You are considered at risk if:  You are sexually active and do not regularly use condoms or know the HIV status of your partner(s).  You take drugs by injection.  You are  sexually active with a partner who has HIV. Talk with your health care provider about whether you are at high risk of being infected with HIV. If you choose to begin PrEP, you should first be tested for HIV. You should then be tested every 3 months for as long as you are taking PrEP.  PREGNANCY   If you are premenopausal and you may become pregnant, ask your health care provider about preconception counseling.  If you may become pregnant, take 400 to 800 micrograms (mcg) of folic acid every day.  If you want to prevent pregnancy, talk to your health care provider about birth control (contraception). OSTEOPOROSIS AND MENOPAUSE   Osteoporosis is a disease in which the bones lose minerals and strength with aging. This can result in serious bone fractures. Your risk for osteoporosis can be identified using a bone density scan.  If you are 44 years of age or older, or if you are at risk for osteoporosis and fractures, ask your health care provider if you should be screened.  Ask your health care provider whether you should take a calcium or vitamin D supplement to lower your risk for osteoporosis.  Menopause may have certain physical symptoms and risks.  Hormone replacement therapy may reduce some of these symptoms and risks. Talk to your health care provider about whether hormone replacement therapy is right for you.  HOME CARE INSTRUCTIONS   Schedule regular health, dental, and eye exams.  Stay current with your immunizations.   Do not use any tobacco products including cigarettes, chewing tobacco, or electronic cigarettes.  If you are pregnant, do not drink alcohol.  If you are breastfeeding, limit how much and how often you drink alcohol.  Limit alcohol intake to no more than 1 drink per day for nonpregnant women. One drink equals 12 ounces of beer, 5 ounces of wine, or 1 ounces of hard liquor.  Do not use street drugs.  Do not share needles.  Ask your health care provider for  help if you need support or information about quitting drugs.  Tell your health care provider if you often feel depressed.  Tell your health care provider if you have ever been abused or do not feel safe at home. Document Released: 05/13/2011 Document Revised: 03/14/2014 Document Reviewed: 09/29/2013 [facility] Patient Information 2015 [place], [place]. This information is not intended to replace advice given to you by your health care provider. Make sure you discuss any questions you have with your health care provider.

## 2015-03-13 NOTE — Progress Notes (Signed)
Pre visit review using our clinic review tool, if applicable. No additional management support is needed unless otherwise documented below in the visit note. 

## 2015-03-16 ENCOUNTER — Other Ambulatory Visit (INDEPENDENT_AMBULATORY_CARE_PROVIDER_SITE_OTHER): Payer: Medicare Other

## 2015-03-16 ENCOUNTER — Encounter: Payer: Self-pay | Admitting: Adult Health

## 2015-03-16 DIAGNOSIS — E785 Hyperlipidemia, unspecified: Secondary | ICD-10-CM | POA: Diagnosis not present

## 2015-03-16 DIAGNOSIS — Z0000 Encounter for general adult medical examination without abnormal findings: Secondary | ICD-10-CM

## 2015-03-16 LAB — BASIC METABOLIC PANEL
BUN: 19 mg/dL (ref 6–23)
CO2: 31 mEq/L (ref 19–32)
Calcium: 9.8 mg/dL (ref 8.4–10.5)
Chloride: 105 mEq/L (ref 96–112)
Creatinine, Ser: 0.7 mg/dL (ref 0.40–1.20)
GFR: 86.31 mL/min (ref >60.00)
GLUCOSE: 98 mg/dL (ref 70–99)
Potassium: 4.6 mEq/L (ref 3.5–5.1)
Sodium: 141 mEq/L (ref 135–145)

## 2015-03-16 LAB — LIPID PANEL
CHOL/HDL RATIO: 2
Cholesterol: 148 mg/dL (ref 0–200)
HDL: 59.6 mg/dL (ref >39.00)
LDL Cholesterol: 79 mg/dL (ref 0–99)
NONHDL: 88.4
Triglycerides: 47 mg/dL (ref 0.0–149.0)
VLDL: 9.4 mg/dL (ref 0.0–40.0)

## 2015-03-16 LAB — TSH: TSH: 2.46 u[IU]/mL (ref 0.35–4.50)

## 2015-03-16 LAB — HEPATIC FUNCTION PANEL
ALT: 15 U/L (ref 0–35)
AST: 20 U/L (ref 0–37)
Albumin: 4.1 g/dL (ref 3.5–5.2)
Alkaline Phosphatase: 64 U/L (ref 39–117)
BILIRUBIN TOTAL: 0.5 mg/dL (ref 0.2–1.2)
Bilirubin, Direct: 0.1 mg/dL (ref 0.0–0.3)
Total Protein: 7.5 g/dL (ref 6.0–8.3)

## 2015-03-16 LAB — CBC
HCT: 35.5 % — ABNORMAL LOW (ref 36.0–46.0)
Hemoglobin: 11.4 g/dL — ABNORMAL LOW (ref 12.0–15.0)
MCHC: 32.2 g/dL (ref 30.0–36.0)
MCV: 74 fl — ABNORMAL LOW (ref 78.0–100.0)
PLATELETS: 229 10*3/uL (ref 150.0–400.0)
RBC: 4.79 Mil/uL (ref 3.87–5.11)
RDW: 20.4 % — ABNORMAL HIGH (ref 11.5–15.5)
WBC: 4.5 10*3/uL (ref 4.0–10.5)

## 2015-03-16 LAB — HEMOGLOBIN A1C: HEMOGLOBIN A1C: 6.3 % (ref 4.6–6.5)

## 2015-03-17 LAB — URINALYSIS, ROUTINE W REFLEX MICROSCOPIC
BILIRUBIN URINE: NEGATIVE
Hgb urine dipstick: NEGATIVE
Ketones, ur: NEGATIVE
LEUKOCYTES UA: NEGATIVE
NITRITE: NEGATIVE
PH: 7 (ref 5.0–8.0)
RBC / HPF: NONE SEEN (ref 0–?)
SPECIFIC GRAVITY, URINE: 1.015 (ref 1.000–1.030)
TOTAL PROTEIN, URINE-UPE24: NEGATIVE
Urine Glucose: NEGATIVE
Urobilinogen, UA: 0.2 (ref 0.0–1.0)
WBC, UA: NONE SEEN (ref 0–?)

## 2015-03-21 NOTE — Telephone Encounter (Signed)
Attempted to call pt.  Will call again tomorrow.

## 2015-03-21 NOTE — Telephone Encounter (Signed)
Your lipid panel looks great! We can try and take you off your Lipitor. Come back in six months to have your lipid panel rechecked.

## 2015-03-22 ENCOUNTER — Telehealth: Payer: Self-pay | Admitting: Adult Health

## 2015-03-22 ENCOUNTER — Other Ambulatory Visit: Payer: Self-pay | Admitting: Adult Health

## 2015-03-22 DIAGNOSIS — Z780 Asymptomatic menopausal state: Secondary | ICD-10-CM

## 2015-03-22 NOTE — Telephone Encounter (Signed)
Left a message for pt to return call or read message on mychart.

## 2015-03-22 NOTE — Telephone Encounter (Signed)
Order entered for [facility].  Ok to schedule.  Thanks!

## 2015-03-22 NOTE — Telephone Encounter (Signed)
Pt was seen on 5-2 and was told it is time for bone scan ?bone density. Can I sch?

## 2015-03-23 NOTE — Telephone Encounter (Signed)
lmom for pt to call back

## 2015-03-27 NOTE — Telephone Encounter (Signed)
lmom for pt to call back

## 2015-03-27 NOTE — Telephone Encounter (Signed)
Pt has been scheduled for done density

## 2015-04-03 ENCOUNTER — Other Ambulatory Visit: Payer: Medicare Other

## 2015-04-05 ENCOUNTER — Ambulatory Visit (INDEPENDENT_AMBULATORY_CARE_PROVIDER_SITE_OTHER)
Admission: RE | Admit: 2015-04-05 | Discharge: 2015-04-05 | Disposition: A | Discharge status: Home / Self Care | Payer: Medicare Other | Source: Ambulatory Visit | Attending: Adult Health | Admitting: Adult Health

## 2015-04-05 DIAGNOSIS — Z780 Asymptomatic menopausal state: Secondary | ICD-10-CM | POA: Diagnosis not present

## 2015-08-11 ENCOUNTER — Ambulatory Visit (INDEPENDENT_AMBULATORY_CARE_PROVIDER_SITE_OTHER): Payer: Medicare Other

## 2015-08-11 DIAGNOSIS — Z23 Encounter for immunization: Secondary | ICD-10-CM

## 2015-09-19 ENCOUNTER — Ambulatory Visit (INDEPENDENT_AMBULATORY_CARE_PROVIDER_SITE_OTHER): Payer: Medicare Other | Admitting: Adult Health

## 2015-09-19 ENCOUNTER — Encounter: Payer: Self-pay | Admitting: Adult Health

## 2015-09-19 VITALS — BP 140/80 | HR 84 | Temp 98.2°F | Wt 134.5 lb

## 2015-09-19 DIAGNOSIS — E7800 Pure hypercholesterolemia, unspecified: Secondary | ICD-10-CM

## 2015-09-19 NOTE — Progress Notes (Signed)
Pre visit review using our clinic review tool, if applicable. No additional management support is needed unless otherwise documented below in the visit note. 

## 2015-09-19 NOTE — Progress Notes (Signed)
Subjective:    Patient ID: Gabriela Gomez, female    DOB: Mar 15, 1936, 79 y.o.   MRN: [id number]  HPI  79 year old female who presents to the office today for six month follow up. At that time she wanted to go off her statin medication and see how she did . She has been watching her diet and is eating healthy. She exercises multiple times per week.   Her blood pressure in the office today is elevated at 164/78. She has been checking it at home and endorses that her blood pressure is normal, in the 601-093 systolic. On her second reading her BP was 142/80.   She has no complaints of headaches, blurred vision or dizziness.    Review of Systems  Constitutional: Negative.   HENT: Negative.   Respiratory: Negative.   Cardiovascular: Negative.   Gastrointestinal: Negative.   Musculoskeletal: Negative.   Skin: Negative.   Neurological: Negative.   All other systems reviewed and are negative.  Past Medical History  Diagnosis Date  . Hyperlipidemia   . Allergy   . Arthritis   . Adenomatous colon polyp     Social History   Social History  . Marital Status: Widowed    Spouse Name: N/A  . Number of Children: N/A  . Years of Education: N/A   Occupational History  . Not on file.   Social History Main Topics  . Smoking status: Never Smoker   . Smokeless tobacco: Never Used  . Alcohol Use: No  . Drug Use: No  . Sexual Activity: Not on file   Other Topics Concern  . Not on file   Social History Narrative   Widowed   Lives with daughter, husband and three grandchildren    [place] [employer] and work [facility].    Two cats as pets      Past Surgical History  Procedure Laterality Date  . Appendectomy      Family History  Problem Relation Age of Onset  . Cancer Mother     throat  . Diabetes Father   . Heart disease Father   . Heart attack Father     No Known Allergies  Current Outpatient Prescriptions on File Prior to Visit  Medication Sig Dispense Refill  .  multivitamin (THERAGRAN) per tablet Take 1 tablet by mouth daily.    Marland Kitchen atorvastatin (LIPITOR) 20 MG tablet TAKE 1 TABLET BY MOUTH EVERY DAY (Patient not taking: Reported on 09/19/2015) 90 tablet 0   No current facility-administered medications on file prior to visit.    BP 164/78 mmHg  Pulse 84  Temp(Src) 98.2 F (36.8 C) (Oral)  Wt 134 lb 8 oz (61.009 kg)       Objective:   Physical Exam  Constitutional: She is oriented to person, place, and time. She appears well-developed and well-nourished. No distress.  Cardiovascular: Normal rate, regular rhythm, normal heart sounds and intact distal pulses.  Exam reveals no friction rub.   No murmur heard. Pulmonary/Chest: Effort normal and breath sounds normal. No respiratory distress. She has no wheezes. She has no rales. She exhibits no tenderness.  Musculoskeletal: Normal range of motion.  Neurological: She is alert and oriented to person, place, and time.  Skin: Skin is warm and dry. No rash noted. She is not diaphoretic. No erythema. No pallor.  Psychiatric: She has a normal mood and affect. Her behavior is normal. Judgment and thought content normal.  Nursing note and vitals reviewed.  Assessment & Plan:  1. Pure hypercholesterolemia - Lipid panel; Future - Basic metabolic panel; Future

## 2015-09-19 NOTE — Patient Instructions (Signed)
It was great seeing you again  Continue to monitor your blood pressures and let me know if they continue to be above 130-140/80.   Follow up for your blood work.   If you need anything, please let me know.

## 2015-09-21 ENCOUNTER — Encounter: Payer: Self-pay | Admitting: Adult Health

## 2015-09-21 ENCOUNTER — Other Ambulatory Visit (INDEPENDENT_AMBULATORY_CARE_PROVIDER_SITE_OTHER): Payer: Medicare Other

## 2015-09-21 DIAGNOSIS — E7800 Pure hypercholesterolemia, unspecified: Secondary | ICD-10-CM | POA: Diagnosis not present

## 2015-09-21 LAB — BASIC METABOLIC PANEL
BUN: 19 mg/dL (ref 6–23)
CHLORIDE: 105 meq/L (ref 96–112)
CO2: 30 mEq/L (ref 19–32)
Calcium: 9.7 mg/dL (ref 8.4–10.5)
Creatinine, Ser: 0.7 mg/dL (ref 0.40–1.20)
GFR: 86.19 mL/min (ref >60.00)
Glucose, Bld: 95 mg/dL (ref 70–99)
POTASSIUM: 5.1 meq/L (ref 3.5–5.1)
SODIUM: 142 meq/L (ref 135–145)

## 2015-09-21 LAB — LIPID PANEL
CHOL/HDL RATIO: 4
Cholesterol: 218 mg/dL — ABNORMAL HIGH (ref 0–200)
HDL: 55.7 mg/dL (ref >39.00)
LDL Cholesterol: 150 mg/dL — ABNORMAL HIGH (ref 0–99)
NONHDL: 161.99
Triglycerides: 60 mg/dL (ref 0.0–149.0)
VLDL: 12 mg/dL (ref 0.0–40.0)

## 2015-09-22 ENCOUNTER — Other Ambulatory Visit: Payer: Self-pay | Admitting: Adult Health

## 2015-09-22 ENCOUNTER — Other Ambulatory Visit: Payer: Self-pay

## 2015-09-22 DIAGNOSIS — Z1231 Encounter for screening mammogram for malignant neoplasm of breast: Secondary | ICD-10-CM

## 2015-09-28 ENCOUNTER — Telehealth: Payer: Self-pay | Admitting: Adult Health

## 2015-09-28 NOTE — Telephone Encounter (Signed)
Pt was seen on 11/8 and bp was elevated and per pt was told to callback if bp is still elevated and cory will put her on bp med. Pt last bp reading 159/80 or something per pt. [facility]

## 2015-09-28 NOTE — Telephone Encounter (Signed)
Please advise 

## 2015-09-29 ENCOUNTER — Other Ambulatory Visit: Payer: Self-pay | Admitting: Adult Health

## 2015-09-29 MED ORDER — LISINOPRIL 5 MG PO TABS: 5.0000 mg | ORAL_TABLET | Freq: Every day | ORAL | Status: DC

## 2015-09-29 NOTE — Telephone Encounter (Signed)
Lisinopril 5 mg sent in. She needs to follow up with me in 2 weeks to see how she is doing. Write blood pressures down on a piece of paper and bring to appointment

## 2015-09-29 NOTE — Telephone Encounter (Signed)
Called and spoke with pt and pt is aware of recommendations. Pt will follow up on 12.2.2016.  Pt is aware.

## 2015-10-13 ENCOUNTER — Encounter: Payer: Self-pay | Admitting: Adult Health

## 2015-10-13 ENCOUNTER — Ambulatory Visit (INDEPENDENT_AMBULATORY_CARE_PROVIDER_SITE_OTHER): Payer: Medicare Other | Admitting: Adult Health

## 2015-10-13 DIAGNOSIS — I10 Essential (primary) hypertension: Secondary | ICD-10-CM | POA: Diagnosis not present

## 2015-10-13 NOTE — Progress Notes (Signed)
Pre visit review using our clinic review tool, if applicable. No additional management support is needed unless otherwise documented below in the visit note. 

## 2015-10-13 NOTE — Progress Notes (Signed)
Subjective:    Patient ID: Gabriela Gomez, female    DOB: [birth year], 79 y.o.   MRN: [id number]  HPI  Gabriela Gomez presents to the office today for 3 week follow up of her blood pressure. During her last visit it was noticed that her blood pressure was elevated ( 140/80), she reported that her blood pressure had been normal [id number] systolic range. We also restarted her on her statin medication, as we tried to go off it for six months in the hopes that her cholesterol would stay WNL. Unfortunately, this was not the case and she had to be restarted on the medication.   Today she reports that she is taking her medications as prescribed. Her blood pressure is controlled and she is having no adverse reactions.    Review of Systems  Constitutional: Negative.   Respiratory: Negative.   Cardiovascular: Negative.   Genitourinary: Negative.   Neurological: Negative.   All other systems reviewed and are negative.  Past Medical History  Diagnosis Date  . Hyperlipidemia   . Allergy   . Arthritis   . Adenomatous colon polyp     Social History   Social History  . Marital Status: Widowed    Spouse Name: N/A  . Number of Children: N/A  . Years of Education: N/A   Occupational History  . Not on file.   Social History Main Topics  . Smoking status: Never Smoker   . Smokeless tobacco: Never Used  . Alcohol Use: No  . Drug Use: No  . Sexual Activity: Not on file   Other Topics Concern  . Not on file   Social History Narrative   Widowed   Lives with daughter, husband and three grandchildren    [place] to [facility] to the [employer] and work out.    Two cats as pets      Past Surgical History  Procedure Laterality Date  . Appendectomy      Family History  Problem Relation Age of Onset  . Cancer Mother     throat  . Diabetes Father   . Heart disease Father   . Heart attack Father     No Known Allergies  Current Outpatient Prescriptions on File Prior to Visit  Medication Sig Dispense Refill  .  atorvastatin (LIPITOR) 20 MG tablet TAKE 1 TABLET BY MOUTH EVERY DAY *PT NEEDS OFFICE VISIT* 90 tablet 0  . lisinopril (PRINIVIL,ZESTRIL) 5 MG tablet Take 1 tablet (5 mg total) by mouth daily. 30 tablet 3  . multivitamin (THERAGRAN) per tablet Take 1 tablet by mouth daily.     No current facility-administered medications on file prior to visit.    BP 130/70 mmHg  Temp(Src) 98 F (36.7 C) (Oral)  Ht 5\' 3"  (1.6 m)  Wt 134 lb 14.4 oz (61.19 kg)  BMI 23.90 kg/m2       Objective:   Physical Exam  Constitutional: She is oriented to person, place, and time. She appears well-developed and well-nourished. No distress.  Cardiovascular: Normal rate, regular rhythm, normal heart sounds and intact distal pulses.  Exam reveals no gallop and no friction rub.   No murmur heard. Pulmonary/Chest: Effort normal and breath sounds normal. No respiratory distress. She has no wheezes. She has no rales. She exhibits no tenderness.  Neurological: She is alert and oriented to person, place, and time.  Skin: Skin is warm and dry. No rash noted. She is not diaphoretic. No erythema. No pallor.  Psychiatric: She has a normal mood  and affect. Her behavior is normal. Judgment and thought content normal.  Vitals reviewed.      Assessment & Plan:  1. Essential hypertension - Near goal at this visit. She is feeling fine. Will not increase medication at this time. Follow up as needed and continue to monitor blood pressure at home.

## 2015-10-16 ENCOUNTER — Ambulatory Visit: Payer: Medicare Other

## 2015-11-01 ENCOUNTER — Other Ambulatory Visit: Payer: Self-pay | Admitting: *Deleted

## 2015-11-01 ENCOUNTER — Ambulatory Visit
Admission: RE | Admit: 2015-11-01 | Discharge: 2015-11-01 | Disposition: A | Discharge status: Home / Self Care | Payer: Medicare Other | Source: Ambulatory Visit

## 2015-11-01 DIAGNOSIS — Z1231 Encounter for screening mammogram for malignant neoplasm of breast: Secondary | ICD-10-CM

## 2015-11-01 MED ORDER — LISINOPRIL 5 MG PO TABS: 5.0000 mg | ORAL_TABLET | Freq: Every day | ORAL | Status: DC

## 2015-11-01 NOTE — Telephone Encounter (Signed)
Rx done. 

## 2015-11-08 ENCOUNTER — Ambulatory Visit (INDEPENDENT_AMBULATORY_CARE_PROVIDER_SITE_OTHER): Payer: Medicare Other | Admitting: Family Medicine

## 2015-11-08 ENCOUNTER — Encounter: Payer: Self-pay | Admitting: Family Medicine

## 2015-11-08 VITALS — BP 140/60 | HR 68 | Temp 98.6°F | Ht 63.0 in | Wt 135.0 lb

## 2015-11-08 DIAGNOSIS — L989 Disorder of the skin and subcutaneous tissue, unspecified: Secondary | ICD-10-CM | POA: Diagnosis not present

## 2015-11-08 MED ORDER — MUPIROCIN 2 % EX OINT: TOPICAL_OINTMENT | CUTANEOUS | Status: DC

## 2015-11-08 NOTE — Progress Notes (Signed)
   Subjective:    Patient ID: Gabriela Gomez, female    DOB: May 22, 1936, 79 y.o.   MRN: [id number]  HPI Here to check some lesions around the nose and mouth that appeared about 4 weeks ago. There are no other symptoms, these areas do not itch and are not sore. They appeared rather quickly and have not changed in appearance much.    Review of Systems  Constitutional: Negative.   Skin: Positive for rash.       Objective:   Physical Exam  Constitutional: She appears well-developed and well-nourished.  She has red hair and a fair complexion   HENT:  Right Ear: External ear normal.  Left Ear: External ear normal.  There are two areas involved, one on the right exterior nostril and one on the left upper lip. These areas are raised in some parts and ulcerated in others. Both have areas that are smooth and pearly on the outer margins, then have red ulcerated areas in the center   Eyes: Conjunctivae are normal.  Neck: No thyromegaly present.  Lymphadenopathy:    She has no cervical adenopathy.          Assessment & Plan:  These skin lesions are worrisome for possible skin cancers. She will apply Mupiricin ointment to cover for any infection. Refer to the [facility] to evaluate and treat

## 2015-11-08 NOTE — Progress Notes (Signed)
Pre visit review using our clinic review tool, if applicable. No additional management support is needed unless otherwise documented below in the visit note. 

## 2015-11-12 DIAGNOSIS — C4491 Basal cell carcinoma of skin, unspecified: Secondary | ICD-10-CM

## 2015-11-12 HISTORY — DX: Basal cell carcinoma of skin, unspecified: C44.91

## 2015-12-17 ENCOUNTER — Other Ambulatory Visit: Payer: Self-pay | Admitting: Adult Health

## 2015-12-26 DIAGNOSIS — C44319 Basal cell carcinoma of skin of other parts of face: Secondary | ICD-10-CM | POA: Diagnosis not present

## 2015-12-26 DIAGNOSIS — D485 Neoplasm of uncertain behavior of skin: Secondary | ICD-10-CM | POA: Diagnosis not present

## 2015-12-26 DIAGNOSIS — L570 Actinic keratosis: Secondary | ICD-10-CM | POA: Diagnosis not present

## 2015-12-26 DIAGNOSIS — C44311 Basal cell carcinoma of skin of nose: Secondary | ICD-10-CM | POA: Diagnosis not present

## 2016-01-01 ENCOUNTER — Encounter: Payer: Self-pay | Admitting: Adult Health

## 2016-02-27 DIAGNOSIS — C4401 Basal cell carcinoma of skin of lip: Secondary | ICD-10-CM | POA: Diagnosis not present

## 2016-03-12 DIAGNOSIS — C44311 Basal cell carcinoma of skin of nose: Secondary | ICD-10-CM | POA: Diagnosis not present

## 2016-03-12 DIAGNOSIS — L570 Actinic keratosis: Secondary | ICD-10-CM | POA: Diagnosis not present

## 2016-04-09 DIAGNOSIS — C44311 Basal cell carcinoma of skin of nose: Secondary | ICD-10-CM | POA: Diagnosis not present

## 2016-05-28 ENCOUNTER — Other Ambulatory Visit: Payer: Self-pay | Admitting: Adult Health

## 2016-05-29 ENCOUNTER — Ambulatory Visit (INDEPENDENT_AMBULATORY_CARE_PROVIDER_SITE_OTHER): Payer: Medicare Other | Admitting: Adult Health

## 2016-05-29 ENCOUNTER — Encounter: Payer: Self-pay | Admitting: Adult Health

## 2016-05-29 VITALS — BP 140/80 | Temp 98.1°F | Ht 63.0 in | Wt 131.2 lb

## 2016-05-29 DIAGNOSIS — I10 Essential (primary) hypertension: Secondary | ICD-10-CM | POA: Diagnosis not present

## 2016-05-29 DIAGNOSIS — E785 Hyperlipidemia, unspecified: Secondary | ICD-10-CM

## 2016-05-29 LAB — BASIC METABOLIC PANEL
BUN: 23 mg/dL (ref 6–23)
CHLORIDE: 102 meq/L (ref 96–112)
CO2: 31 meq/L (ref 19–32)
CREATININE: 0.71 mg/dL (ref 0.40–1.20)
Calcium: 9.8 mg/dL (ref 8.4–10.5)
GFR: 84.64 mL/min (ref >60.00)
GLUCOSE: 95 mg/dL (ref 70–99)
POTASSIUM: 4.4 meq/L (ref 3.5–5.1)
Sodium: 140 mEq/L (ref 135–145)

## 2016-05-29 LAB — LIPID PANEL
CHOL/HDL RATIO: 3
CHOLESTEROL: 168 mg/dL (ref 0–200)
HDL: 62.1 mg/dL (ref >39.00)
LDL Cholesterol: 92 mg/dL (ref 0–99)
NonHDL: 105.5
TRIGLYCERIDES: 67 mg/dL (ref 0.0–149.0)
VLDL: 13.4 mg/dL (ref 0.0–40.0)

## 2016-05-29 NOTE — Patient Instructions (Signed)
It was great seeing you again today.   I will follow up with you regarding your blood work   Please follow up with me for a physical   If you need anything, please let me know

## 2016-05-29 NOTE — Progress Notes (Signed)
Subjective:    Patient ID: Gabriela Gomez, female    DOB: [birth year], 80 y.o.   MRN: [id number]  HPI  80 year old female who presents for six month follow up regarding hypertension and hyperlipidemia. I last saw her on 10/2015 at which time she was restarted on her statin medication.   She was well controlled on lisinopril 5 mg during the last visit.   Today in the office she reports that everything is going well. She has no complaints.   She checks her blood pressure regularly at home and it has been in the 130's/70. She has not taken her medication this morning yet.   She continues to try and eat healthy and is active.   Overall, she feels " very good"   Review of Systems  Constitutional: Negative.   Respiratory: Negative.   Cardiovascular: Negative.   Musculoskeletal: Negative.   Neurological: Negative.   Hematological: Negative.   All other systems reviewed and are negative.  Past Medical History  Diagnosis Date  . Hyperlipidemia   . Allergy   . Arthritis   . Adenomatous colon polyp   . Basal cell carcinoma 2017    Left upper lip & right [place]    Social History   Social History  . Marital Status: Widowed    Spouse Name: N/A  . Number of Children: N/A  . Years of Education: N/A   Occupational History  . Not on file.   Social History Main Topics  . Smoking status: Never Smoker   . Smokeless tobacco: Never Used  . Alcohol Use: No  . Drug Use: No  . Sexual Activity: Not on file   Other Topics Concern  . Not on file   Social History Narrative   Widowed   Lives with daughter, husband and three grandchildren    [place] to [facility] to the [employer] and work out.    Two cats as pets      Past Surgical History  Procedure Laterality Date  . Appendectomy      Family History  Problem Relation Age of Onset  . Cancer Mother     throat  . Diabetes Father   . Heart disease Father   . Heart attack Father     No Known Allergies  Current Outpatient Prescriptions on  File Prior to Visit  Medication Sig Dispense Refill  . atorvastatin (LIPITOR) 20 MG tablet TAKE 1 TABLET BY MOUTH EVERY DAY *PT NEEDS OFFICE VISIT* 90 tablet 1  . lisinopril (PRINIVIL,ZESTRIL) 5 MG tablet TAKE 1 TABLET BY MOUTH EVERY DAY 90 tablet 1  . multivitamin (THERAGRAN) per tablet Take 1 tablet by mouth daily.    . mupirocin ointment (BACTROBAN) 2 % Apply twice daily 22 g 0   No current facility-administered medications on file prior to visit.    BP 140/80 mmHg  Temp(Src) 98.1 F (36.7 C) (Oral)  Ht 5\' 3"  (1.6 m)  Wt 131 lb 3.2 oz (59.512 kg)  BMI 23.25 kg/m2       Objective:   Physical Exam  Constitutional: She appears well-developed and well-nourished. No distress.  Cardiovascular: Normal rate, regular rhythm, normal heart sounds and intact distal pulses.  Exam reveals no gallop and no friction rub.   No murmur heard. Pulmonary/Chest: Effort normal and breath sounds normal. No respiratory distress. She has no wheezes. She has no rales. She exhibits no tenderness.  Neurological: She is alert.  Skin: Skin is warm and dry. No rash noted. She  is not diaphoretic. No erythema.  Psychiatric: She has a normal mood and affect. Her behavior is normal. Judgment and thought content normal.  Nursing note and vitals reviewed.     Assessment & Plan:  1. Essential hypertension - Near goal - No change  - Lipid panel - Basic metabolic panel  2. Hyperlipidemia - Lipid panel - Basic metabolic panel - Consider increasing Lipitor  Dorothyann Peng, NP

## 2016-06-16 ENCOUNTER — Other Ambulatory Visit: Payer: Self-pay | Admitting: Adult Health

## 2016-07-23 DIAGNOSIS — Z85828 Personal history of other malignant neoplasm of skin: Secondary | ICD-10-CM | POA: Diagnosis not present

## 2016-07-23 DIAGNOSIS — D1801 Hemangioma of skin and subcutaneous tissue: Secondary | ICD-10-CM | POA: Diagnosis not present

## 2016-07-23 DIAGNOSIS — L820 Inflamed seborrheic keratosis: Secondary | ICD-10-CM | POA: Diagnosis not present

## 2016-07-23 DIAGNOSIS — D485 Neoplasm of uncertain behavior of skin: Secondary | ICD-10-CM | POA: Diagnosis not present

## 2016-07-23 DIAGNOSIS — L821 Other seborrheic keratosis: Secondary | ICD-10-CM | POA: Diagnosis not present

## 2016-07-23 DIAGNOSIS — L814 Other melanin hyperpigmentation: Secondary | ICD-10-CM | POA: Diagnosis not present

## 2016-07-23 DIAGNOSIS — L570 Actinic keratosis: Secondary | ICD-10-CM | POA: Diagnosis not present

## 2016-08-22 ENCOUNTER — Other Ambulatory Visit (INDEPENDENT_AMBULATORY_CARE_PROVIDER_SITE_OTHER): Payer: Medicare Other

## 2016-08-22 DIAGNOSIS — Z0000 Encounter for general adult medical examination without abnormal findings: Secondary | ICD-10-CM | POA: Diagnosis not present

## 2016-08-22 LAB — POC URINALSYSI DIPSTICK (AUTOMATED)
Bilirubin, UA: NEGATIVE
GLUCOSE UA: NEGATIVE
KETONES UA: NEGATIVE
LEUKOCYTES UA: NEGATIVE
Nitrite, UA: NEGATIVE
PROTEIN UA: NEGATIVE
Spec Grav, UA: 1.015
Urobilinogen, UA: 0.2
pH, UA: 6

## 2016-08-22 LAB — CBC WITH DIFFERENTIAL/PLATELET
BASOS PCT: 1 % (ref 0.0–3.0)
Basophils Absolute: 0 10*3/uL (ref 0.0–0.1)
EOS ABS: 0.1 10*3/uL (ref 0.0–0.7)
EOS PCT: 2.4 % (ref 0.0–5.0)
HCT: 39 % (ref 36.0–46.0)
HEMOGLOBIN: 13.1 g/dL (ref 12.0–15.0)
LYMPHS ABS: 2.7 10*3/uL (ref 0.7–4.0)
Lymphocytes Relative: 54.2 % — ABNORMAL HIGH (ref 12.0–46.0)
MCHC: 33.5 g/dL (ref 30.0–36.0)
MCV: 86.6 fl (ref 78.0–100.0)
MONO ABS: 0.3 10*3/uL (ref 0.1–1.0)
Monocytes Relative: 7 % (ref 3.0–12.0)
NEUTROS ABS: 1.7 10*3/uL (ref 1.4–7.7)
NEUTROS PCT: 35.4 % — AB (ref 43.0–77.0)
PLATELETS: 212 10*3/uL (ref 150.0–400.0)
RBC: 4.51 Mil/uL (ref 3.87–5.11)
RDW: 15.6 % — AB (ref 11.5–15.5)
WBC: 4.9 10*3/uL (ref 4.0–10.5)

## 2016-08-22 LAB — LIPID PANEL
CHOLESTEROL: 132 mg/dL (ref 0–200)
HDL: 49.1 mg/dL (ref >39.00)
LDL CALC: 70 mg/dL (ref 0–99)
NonHDL: 82.83
TRIGLYCERIDES: 63 mg/dL (ref 0.0–149.0)
Total CHOL/HDL Ratio: 3
VLDL: 12.6 mg/dL (ref 0.0–40.0)

## 2016-08-22 LAB — HEPATIC FUNCTION PANEL
ALT: 12 U/L (ref 0–35)
AST: 18 U/L (ref 0–37)
Albumin: 3.8 g/dL (ref 3.5–5.2)
Alkaline Phosphatase: 60 U/L (ref 39–117)
BILIRUBIN DIRECT: 0 mg/dL (ref 0.0–0.3)
BILIRUBIN TOTAL: 0.5 mg/dL (ref 0.2–1.2)
Total Protein: 6.9 g/dL (ref 6.0–8.3)

## 2016-08-22 LAB — BASIC METABOLIC PANEL
BUN: 22 mg/dL (ref 6–23)
CHLORIDE: 103 meq/L (ref 96–112)
CO2: 32 meq/L (ref 19–32)
CREATININE: 0.74 mg/dL (ref 0.40–1.20)
Calcium: 9.5 mg/dL (ref 8.4–10.5)
GFR: 80.64 mL/min (ref >60.00)
GLUCOSE: 86 mg/dL (ref 70–99)
POTASSIUM: 4.3 meq/L (ref 3.5–5.1)
Sodium: 140 mEq/L (ref 135–145)

## 2016-08-22 LAB — TSH: TSH: 3.56 u[IU]/mL (ref 0.35–4.50)

## 2016-08-29 ENCOUNTER — Encounter: Payer: Self-pay | Admitting: Adult Health

## 2016-08-29 ENCOUNTER — Ambulatory Visit (INDEPENDENT_AMBULATORY_CARE_PROVIDER_SITE_OTHER): Payer: Medicare Other | Admitting: Adult Health

## 2016-08-29 VITALS — BP 160/62 | Temp 98.1°F | Ht 63.0 in | Wt 129.4 lb

## 2016-08-29 DIAGNOSIS — Z23 Encounter for immunization: Secondary | ICD-10-CM | POA: Diagnosis not present

## 2016-08-29 DIAGNOSIS — Z0000 Encounter for general adult medical examination without abnormal findings: Secondary | ICD-10-CM

## 2016-08-29 DIAGNOSIS — I10 Essential (primary) hypertension: Secondary | ICD-10-CM | POA: Diagnosis not present

## 2016-08-29 DIAGNOSIS — E785 Hyperlipidemia, unspecified: Secondary | ICD-10-CM | POA: Diagnosis not present

## 2016-08-29 MED ORDER — LISINOPRIL 5 MG PO TABS: 5.0000 mg | ORAL_TABLET | Freq: Every day | ORAL | 3 refills | Status: DC

## 2016-08-29 MED ORDER — ATORVASTATIN CALCIUM 20 MG PO TABS: ORAL_TABLET | ORAL | 3 refills | Status: DC

## 2016-08-29 NOTE — Patient Instructions (Addendum)
It was great seeing you today!  Your exam and labs were all great! Keep up the good work.   Please follow up with me in one year for your next physical. Of course, if you need anything before that then please let me know  Health Maintenance, Female Adopting a healthy lifestyle and getting preventive care can go a long way to promote health and wellness. Talk with your health care provider about what schedule of regular examinations is right for you. This is a good chance for you to check in with your provider about disease prevention and staying healthy. In between checkups, there are plenty of things you can do on your own. Experts have done a lot of research about which lifestyle changes and preventive measures are most likely to keep you healthy. Ask your health care provider for more information. WEIGHT AND DIET  Eat a healthy diet  Be sure to include plenty of vegetables, fruits, low-fat dairy products, and lean protein.  Do not eat a lot of foods high in solid fats, added sugars, or salt.  Get regular exercise. This is one of the most important things you can do for your health.  Most adults should exercise for at least 150 minutes each week. The exercise should increase your heart rate and make you sweat (moderate-intensity exercise).  Most adults should also do strengthening exercises at least twice a week. This is in addition to the moderate-intensity exercise.  Maintain a healthy weight  Body mass index (BMI) is a measurement that can be used to identify possible weight problems. It estimates body fat based on height and weight. Your health care provider can help determine your BMI and help you achieve or maintain a healthy weight.  For females 63 years of age and older:   A BMI below 18.5 is considered underweight.  A BMI of 18.5 to 24.9 is normal.  A BMI of 25 to 29.9 is considered overweight.  A BMI of 30 and above is considered obese.  Watch levels of cholesterol  and blood lipids  You should start having your blood tested for lipids and cholesterol at 78 years of age, then have this test every 5 years.  You may need to have your cholesterol levels checked more often if:  Your lipid or cholesterol levels are high.  You are older than 78 years of age.  You are at high risk for heart disease.  CANCER SCREENING   Lung Cancer  Lung cancer screening is recommended for adults 8-35 years old who are at high risk for lung cancer because of a history of smoking.  A yearly low-dose CT scan of the lungs is recommended for people who:  Currently smoke.  Have quit within the past 15 years.  Have at least a 30-pack-year history of smoking. A pack year is smoking an average of one pack of cigarettes a day for 1 year.  Yearly screening should continue until it has been 15 years since you quit.  Yearly screening should stop if you develop a health problem that would prevent you from having lung cancer treatment.  Breast Cancer  Practice breast self-awareness. This means understanding how your breasts normally appear and feel.  It also means doing regular breast self-exams. Let your health care provider know about any changes, no matter how small.  If you are in your 20s or 30s, you should have a clinical breast exam (CBE) by a health care provider every 1-3 years as part of  a regular health exam.  If you are 70 or older, have a CBE every year. Also consider having a breast X-ray (mammogram) every year.  If you have a family history of breast cancer, talk to your health care provider about genetic screening.  If you are at high risk for breast cancer, talk to your health care provider about having an MRI and a mammogram every year.  Breast cancer gene (BRCA) assessment is recommended for women who have family members with BRCA-related cancers. BRCA-related cancers include:  Breast.  Ovarian.  Tubal.  Peritoneal cancers.  Results of the  assessment will determine the need for genetic counseling and BRCA1 and BRCA2 testing. Cervical Cancer Your health care provider may recommend that you be screened regularly for cancer of the pelvic organs (ovaries, uterus, and vagina). This screening involves a pelvic examination, including checking for microscopic changes to the surface of your cervix (Pap test). You may be encouraged to have this screening done every 3 years, beginning at age 70.  For women ages 55-65, health care providers may recommend pelvic exams and Pap testing every 3 years, or they may recommend the Pap and pelvic exam, combined with testing for human papilloma virus (HPV), every 5 years. Some types of HPV increase your risk of cervical cancer. Testing for HPV may also be done on women of any age with unclear Pap test results.  Other health care providers may not recommend any screening for nonpregnant women who are considered low risk for pelvic cancer and who do not have symptoms. Ask your health care provider if a screening pelvic exam is right for you.  If you have had past treatment for cervical cancer or a condition that could lead to cancer, you need Pap tests and screening for cancer for at least 20 years after your treatment. If Pap tests have been discontinued, your risk factors (such as having a new sexual partner) need to be reassessed to determine if screening should resume. Some women have medical problems that increase the chance of getting cervical cancer. In these cases, your health care provider may recommend more frequent screening and Pap tests. Colorectal Cancer  This type of cancer can be detected and often prevented.  Routine colorectal cancer screening usually begins at 78 years of age and continues through 78 years of age.  Your health care provider may recommend screening at an earlier age if you have risk factors for colon cancer.  Your health care provider may also recommend using home test kits  to check for hidden blood in the stool.  A small camera at the end of a tube can be used to examine your colon directly (sigmoidoscopy or colonoscopy). This is done to check for the earliest forms of colorectal cancer.  Routine screening usually begins at age 28.  Direct examination of the colon should be repeated every 5-10 years through 78 years of age. However, you may need to be screened more often if early forms of precancerous polyps or small growths are found. Skin Cancer  Check your skin from head to toe regularly.  Tell your health care provider about any new moles or changes in moles, especially if there is a change in a mole's shape or color.  Also tell your health care provider if you have a mole that is larger than the size of a pencil eraser.  Always use sunscreen. Apply sunscreen liberally and repeatedly throughout the day.  Protect yourself by wearing long sleeves, pants, a wide-brimmed  hat, and sunglasses whenever you are outside. HEART DISEASE, DIABETES, AND HIGH BLOOD PRESSURE   High blood pressure causes heart disease and increases the risk of stroke. High blood pressure is more likely to develop in:  People who have blood pressure in the high end of the normal range (130-139/85-89 mm Hg).  People who are overweight or obese.  People who are African American.  If you are 82-40 years of age, have your blood pressure checked every 3-5 years. If you are 10 years of age or older, have your blood pressure checked every year. You should have your blood pressure measured twice--once when you are at a hospital or clinic, and once when you are not at a hospital or clinic. Record the average of the two measurements. To check your blood pressure when you are not at a hospital or clinic, you can use:  An automated blood pressure machine at a pharmacy.  A home blood pressure monitor.  If you are between 76 years and 5 years old, ask your health care provider if you should  take aspirin to prevent strokes.  Have regular diabetes screenings. This involves taking a blood sample to check your fasting blood sugar level.  If you are at a normal weight and have a low risk for diabetes, have this test once every three years after 78 years of age.  If you are overweight and have a high risk for diabetes, consider being tested at a younger age or more often. PREVENTING INFECTION  Hepatitis B  If you have a higher risk for hepatitis B, you should be screened for this virus. You are considered at high risk for hepatitis B if:  You were born in a country where hepatitis B is common. Ask your health care provider which countries are considered high risk.  Your parents were born in a high-risk country, and you have not been immunized against hepatitis B (hepatitis B vaccine).  You have HIV or AIDS.  You use needles to inject street drugs.  You live with someone who has hepatitis B.  You have had sex with someone who has hepatitis B.  You get hemodialysis treatment.  You take certain medicines for conditions, including cancer, organ transplantation, and autoimmune conditions. Hepatitis C  Blood testing is recommended for:  Everyone born from [birth year] through 1963.  Anyone with known risk factors for hepatitis C. Sexually transmitted infections (STIs)  You should be screened for sexually transmitted infections (STIs) including gonorrhea and chlamydia if:  You are sexually active and are younger than 78 years of age.  You are older than 78 years of age and your health care provider tells you that you are at risk for this type of infection.  Your sexual activity has changed since you were last screened and you are at an increased risk for chlamydia or gonorrhea. Ask your health care provider if you are at risk.  If you do not have HIV, but are at risk, it may be recommended that you take a prescription medicine daily to prevent HIV infection. This is called  pre-exposure prophylaxis (PrEP). You are considered at risk if:  You are sexually active and do not regularly use condoms or know the HIV status of your partner(s).  You take drugs by injection.  You are sexually active with a partner who has HIV. Talk with your health care provider about whether you are at high risk of being infected with HIV. If you choose to begin PrEP, you  should first be tested for HIV. You should then be tested every 3 months for as long as you are taking PrEP.  PREGNANCY   If you are premenopausal and you may become pregnant, ask your health care provider about preconception counseling.  If you may become pregnant, take 400 to 800 micrograms (mcg) of folic acid every day.  If you want to prevent pregnancy, talk to your health care provider about birth control (contraception). OSTEOPOROSIS AND MENOPAUSE   Osteoporosis is a disease in which the bones lose minerals and strength with aging. This can result in serious bone fractures. Your risk for osteoporosis can be identified using a bone density scan.  If you are 54 years of age or older, or if you are at risk for osteoporosis and fractures, ask your health care provider if you should be screened.  Ask your health care provider whether you should take a calcium or vitamin D supplement to lower your risk for osteoporosis.  Menopause may have certain physical symptoms and risks.  Hormone replacement therapy may reduce some of these symptoms and risks. Talk to your health care provider about whether hormone replacement therapy is right for you.  HOME CARE INSTRUCTIONS   Schedule regular health, dental, and eye exams.  Stay current with your immunizations.   Do not use any tobacco products including cigarettes, chewing tobacco, or electronic cigarettes.  If you are pregnant, do not drink alcohol.  If you are breastfeeding, limit how much and how often you drink alcohol.  Limit alcohol intake to no more than 1  drink per day for nonpregnant women. One drink equals 12 ounces of beer, 5 ounces of wine, or 1 ounces of hard liquor.  Do not use street drugs.  Do not share needles.  Ask your health care provider for help if you need support or information about quitting drugs.  Tell your health care provider if you often feel depressed.  Tell your health care provider if you have ever been abused or do not feel safe at home.   This information is not intended to replace advice given to you by your health care provider. Make sure you discuss any questions you have with your health care provider.   Document Released: 05/13/2011 Document Revised: 11/18/2014 Document Reviewed: 09/29/2013 Elsevier Interactive Patient Education [employer].

## 2016-08-29 NOTE — Progress Notes (Signed)
Subjective:    Patient ID: Gabriela Gomez, female    DOB: 27-Apr-1936, 80 y.o.   MRN: [id number]  HPI  Patient presents for yearly preventative medicine examination.She is a pleasant 80 year old female who  has a past medical history of Adenomatous colon polyp; Allergy; Arthritis; Basal cell carcinoma (2017); and Hyperlipidemia.  All immunizations and health maintenance protocols were reviewed with the patient and needed orders were placed.  She takes lisinopril 5 mg and reports that her blood pressures at home are consistently at 130/70. She has always had elevated blood pressures reading when she comes to the office  She takes Lipitor 20 mg for hyperlipidemia.   Medication reconciliation,  past medical history, social history, problem list and allergies were reviewed in detail with the patient  Goals were established with regard to weight loss, exercise, and  diet in compliance with medications  End of life planning was discussed.  She continues to get mammograms yearly. Bone density utd. She no longer needs colonoscopy. Does not go to the dentist and has not seen her eye doctor. She dos monthly SBE and has not noticed any changes  She denies any interval history   Review of Systems  Constitutional: Negative.   HENT: Negative.   Eyes: Negative.   Respiratory: Negative.   Cardiovascular: Negative.   Gastrointestinal: Negative.   Endocrine: Negative.   Genitourinary: Negative.   Musculoskeletal: Negative.   Skin: Negative.   Allergic/Immunologic: Negative.   Neurological: Negative.   Hematological: Negative.   Psychiatric/Behavioral: Negative.   All other systems reviewed and are negative.  Past Medical History:  Diagnosis Date  . Adenomatous colon polyp   . Allergy   . Arthritis   . Basal cell carcinoma 2017   Left upper lip & right Ala Nasi  . Hyperlipidemia     Social History   Social History  . Marital status: Widowed    Spouse name: N/A  . Number of  children: N/A  . Years of education: N/A   Occupational History  . Not on file.   Social History Main Topics  . Smoking status: Never Smoker  . Smokeless tobacco: Never Used  . Alcohol use No  . Drug use: No  . Sexual activity: Not on file   Other Topics Concern  . Not on file   Social History Narrative   Widowed   Lives with daughter, husband and three grandchildren    [facility] to [facility] [employer] and work out.    Two cats as pets      Past Surgical History:  Procedure Laterality Date  . APPENDECTOMY      Family History  Problem Relation Age of Onset  . Cancer Mother     throat  . Diabetes Father   . Heart disease Father   . Heart attack Father     No Known Allergies  Current Outpatient Prescriptions on File Prior to Visit  Medication Sig Dispense Refill  . multivitamin (THERAGRAN) per tablet Take 1 tablet by mouth daily.     No current facility-administered medications on file prior to visit.     BP (!) 160/62   Temp 98.1 F (36.7 C) (Oral)   Ht 5\' 3"  (1.6 m)   Wt 129 lb 6.4 oz (58.7 kg)   BMI 22.92 kg/m       Objective:   Physical Exam  Constitutional: She is oriented to person, place, and time. She appears well-developed and well-nourished. No distress.  HENT:  Head: Normocephalic and atraumatic.  Right Ear: External ear normal.  Left Ear: External ear normal.  Nose: Nose normal.  Mouth/Throat: Oropharynx is clear and moist. No oropharyngeal exudate.  Eyes: Conjunctivae and EOM are normal. Pupils are equal, round, and reactive to light. Right eye exhibits no discharge. Left eye exhibits no discharge. No scleral icterus.  Neck: Normal range of motion. Neck supple. No JVD present. Carotid bruit is not present. No tracheal deviation present. No thyroid mass and no thyromegaly present.  Cardiovascular: Normal rate, regular rhythm, normal heart sounds and intact distal pulses.  Exam reveals no gallop and no friction rub.   No murmur heard. Pulmonary/Chest:  Effort normal and breath sounds normal. No stridor. No respiratory distress. She has no wheezes. She has no rales. She exhibits no tenderness.  Abdominal: Soft. Bowel sounds are normal. She exhibits no distension and no mass. There is no tenderness. There is no rebound and no guarding.  Genitourinary:  Genitourinary Comments: Breast exam: No masses, lumps, dimpling or discharge.   Musculoskeletal: Normal range of motion. She exhibits no edema, tenderness or deformity.  Lymphadenopathy:    She has no cervical adenopathy.  Neurological: She is alert and oriented to person, place, and time. She has normal reflexes. She displays normal reflexes. No cranial nerve deficit. She exhibits normal muscle tone. Coordination normal.  Skin: Skin is warm and dry. No rash noted. She is not diaphoretic. No erythema. No pallor.  Psychiatric: She has a normal mood and affect. Her behavior is normal. Judgment and thought content normal.  Nursing note and vitals reviewed.     Assessment & Plan:  1. Routine general medical examination at a health care facility - Benign exam  - reviewed labs with patient and all questions answered - Continue to exercise and eat healthy  - Follow up in one year or sooner if needed  2. Essential hypertension - EKG 12-Lead- Sinus Brady, Rate 55 - lisinopril (PRINIVIL,ZESTRIL) 5 MG tablet; Take 1 tablet (5 mg total) by mouth daily.  Dispense: 90 tablet; Refill: 3 - Well controlled  3. Hyperlipidemia, unspecified hyperlipidemia type - atorvastatin (LIPITOR) 20 MG tablet; TAKE 1 TABLET BY MOUTH EVERY DAY  Dispense: 90 tablet; Refill: 3 - Well controlled with lipitor   4. Need for prophylactic vaccination and inoculation against influenza - Flu vaccine HIGH DOSE PF (Fluzone High dose)  Dorothyann Peng, NP

## 2016-09-26 ENCOUNTER — Other Ambulatory Visit: Payer: Self-pay | Admitting: Adult Health

## 2016-09-26 DIAGNOSIS — Z1231 Encounter for screening mammogram for malignant neoplasm of breast: Secondary | ICD-10-CM

## 2016-11-07 ENCOUNTER — Ambulatory Visit
Admission: RE | Admit: 2016-11-07 | Discharge: 2016-11-07 | Disposition: A | Discharge status: Home / Self Care | Payer: Medicare Other | Source: Ambulatory Visit | Attending: Adult Health | Admitting: Adult Health

## 2016-11-07 DIAGNOSIS — Z1231 Encounter for screening mammogram for malignant neoplasm of breast: Secondary | ICD-10-CM

## 2017-01-21 DIAGNOSIS — D1801 Hemangioma of skin and subcutaneous tissue: Secondary | ICD-10-CM | POA: Diagnosis not present

## 2017-01-21 DIAGNOSIS — L821 Other seborrheic keratosis: Secondary | ICD-10-CM | POA: Diagnosis not present

## 2017-01-21 DIAGNOSIS — L814 Other melanin hyperpigmentation: Secondary | ICD-10-CM | POA: Diagnosis not present

## 2017-01-21 DIAGNOSIS — L718 Other rosacea: Secondary | ICD-10-CM | POA: Diagnosis not present

## 2017-05-21 DIAGNOSIS — D1801 Hemangioma of skin and subcutaneous tissue: Secondary | ICD-10-CM | POA: Diagnosis not present

## 2017-05-21 DIAGNOSIS — L821 Other seborrheic keratosis: Secondary | ICD-10-CM | POA: Diagnosis not present

## 2017-05-21 DIAGNOSIS — L814 Other melanin hyperpigmentation: Secondary | ICD-10-CM | POA: Diagnosis not present

## 2017-05-21 DIAGNOSIS — L719 Rosacea, unspecified: Secondary | ICD-10-CM | POA: Diagnosis not present

## 2017-08-01 ENCOUNTER — Encounter: Payer: Self-pay | Admitting: Adult Health

## 2017-08-20 ENCOUNTER — Ambulatory Visit (INDEPENDENT_AMBULATORY_CARE_PROVIDER_SITE_OTHER): Payer: Medicare Other

## 2017-08-20 ENCOUNTER — Other Ambulatory Visit: Payer: Medicare Other

## 2017-08-20 DIAGNOSIS — Z23 Encounter for immunization: Secondary | ICD-10-CM | POA: Diagnosis not present

## 2017-09-02 ENCOUNTER — Encounter: Payer: Medicare Other | Admitting: Adult Health

## 2017-09-02 ENCOUNTER — Ambulatory Visit (INDEPENDENT_AMBULATORY_CARE_PROVIDER_SITE_OTHER): Payer: Medicare Other | Admitting: Adult Health

## 2017-09-02 VITALS — BP 162/70 | Temp 97.7°F | Ht 62.0 in | Wt 133.0 lb

## 2017-09-02 DIAGNOSIS — E785 Hyperlipidemia, unspecified: Secondary | ICD-10-CM

## 2017-09-02 DIAGNOSIS — I10 Essential (primary) hypertension: Secondary | ICD-10-CM | POA: Diagnosis not present

## 2017-09-02 DIAGNOSIS — Z0000 Encounter for general adult medical examination without abnormal findings: Secondary | ICD-10-CM

## 2017-09-02 LAB — BASIC METABOLIC PANEL
BUN: 23 mg/dL (ref 6–23)
CHLORIDE: 102 meq/L (ref 96–112)
CO2: 33 meq/L — AB (ref 19–32)
Calcium: 9.7 mg/dL (ref 8.4–10.5)
Creatinine, Ser: 0.63 mg/dL (ref 0.40–1.20)
GFR: 96.84 mL/min (ref >60.00)
GLUCOSE: 101 mg/dL — AB (ref 70–99)
POTASSIUM: 4.3 meq/L (ref 3.5–5.1)
SODIUM: 142 meq/L (ref 135–145)

## 2017-09-02 LAB — HEPATIC FUNCTION PANEL
ALBUMIN: 4.5 g/dL (ref 3.5–5.2)
ALK PHOS: 61 U/L (ref 39–117)
ALT: 17 U/L (ref 0–35)
AST: 23 U/L (ref 0–37)
BILIRUBIN DIRECT: 0.1 mg/dL (ref 0.0–0.3)
Total Bilirubin: 0.6 mg/dL (ref 0.2–1.2)
Total Protein: 7.4 g/dL (ref 6.0–8.3)

## 2017-09-02 LAB — CBC WITH DIFFERENTIAL/PLATELET
BASOS ABS: 0 10*3/uL (ref 0.0–0.1)
Basophils Relative: 0.6 % (ref 0.0–3.0)
EOS ABS: 0.1 10*3/uL (ref 0.0–0.7)
Eosinophils Relative: 1.8 % (ref 0.0–5.0)
HCT: 44 % (ref 36.0–46.0)
Hemoglobin: 14.7 g/dL (ref 12.0–15.0)
LYMPHS ABS: 2.3 10*3/uL (ref 0.7–4.0)
LYMPHS PCT: 36 % (ref 12.0–46.0)
MCHC: 33.5 g/dL (ref 30.0–36.0)
MCV: 92.7 fl (ref 78.0–100.0)
MONOS PCT: 5.9 % (ref 3.0–12.0)
Monocytes Absolute: 0.4 10*3/uL (ref 0.1–1.0)
NEUTROS PCT: 55.7 % (ref 43.0–77.0)
Neutro Abs: 3.5 10*3/uL (ref 1.4–7.7)
PLATELETS: 205 10*3/uL (ref 150.0–400.0)
RBC: 4.74 Mil/uL (ref 3.87–5.11)
RDW: 13.7 % (ref 11.5–15.5)
WBC: 6.3 10*3/uL (ref 4.0–10.5)

## 2017-09-02 LAB — LIPID PANEL
CHOL/HDL RATIO: 3
Cholesterol: 167 mg/dL (ref 0–200)
HDL: 62.5 mg/dL (ref >39.00)
LDL CALC: 92 mg/dL (ref 0–99)
NONHDL: 104.59
Triglycerides: 63 mg/dL (ref 0.0–149.0)
VLDL: 12.6 mg/dL (ref 0.0–40.0)

## 2017-09-02 LAB — TSH: TSH: 2.49 u[IU]/mL (ref 0.35–4.50)

## 2017-09-02 MED ORDER — LISINOPRIL 5 MG PO TABS: 5.0000 mg | ORAL_TABLET | Freq: Every day | ORAL | 3 refills | Status: DC

## 2017-09-02 MED ORDER — ATORVASTATIN CALCIUM 20 MG PO TABS: ORAL_TABLET | ORAL | 3 refills | Status: DC

## 2017-09-02 NOTE — Patient Instructions (Signed)
It was great seeing you today   I will follow up with you regarding your labs   You are doing great! Keep up the hard work    If you need anything, please let me know

## 2017-09-02 NOTE — Progress Notes (Signed)
Subjective:    Patient ID: Gabriela Gomez, female    DOB: [birth year], 81 y.o.   MRN: [id number]  HPI Patient presents for yearly preventative medicine examination. She is a pleasant 81 year old female who  has a past medical history of Adenomatous colon polyp; Allergy; Arthritis; Basal cell carcinoma (2017); and Hyperlipidemia.  She takes lisinopril 5 mg daily for hypertension. She reports that she checks her blood pressure at home and has readings of 120's-130's /80's. She does get elevated blood pressure readings when she comes into the office   She takes Lipitor 20 mg for hyperlipidemia   All immunizations and health maintenance protocols were reviewed with the patient and needed orders were placed.  Appropriate screening laboratory values were ordered for the patient including screening of hyperlipidemia, renal function and hepatic function.  Medication reconciliation,  past medical history, social history, problem list and allergies were reviewed in detail with the patient  Goals were established with regard to weight loss, exercise, and  diet in compliance with medications. She stays active and eats healthy.   End of life planning was discussed. She has an advanced directive and living will.   She continues to get mammograms yearly. Bone density utd. She no longer needs colonoscopy. Does not go to the dentist and has not seen her eye doctor. She dos monthly SBE and has not noticed any changes  Review of Systems  Constitutional: Negative.   HENT: Negative.   Eyes: Negative.   Respiratory: Negative.   Cardiovascular: Negative.   Gastrointestinal: Negative.   Endocrine: Negative.   Genitourinary: Negative.   Musculoskeletal: Negative.   Skin: Negative.   Allergic/Immunologic: Negative.   Neurological: Negative.   Hematological: Negative.   Psychiatric/Behavioral: Negative.   All other systems reviewed and are negative.  Past Medical History:  Diagnosis Date  . Adenomatous  colon polyp   . Allergy   . Arthritis   . Basal cell carcinoma 2017   Left upper lip & right Ala Nasi  . Hyperlipidemia     Social History   Social History  . Marital status: Widowed    Spouse name: N/A  . Number of children: N/A  . Years of education: N/A   Occupational History  . Not on file.   Social History Main Topics  . Smoking status: Never Smoker  . Smokeless tobacco: Never Used  . Alcohol use No  . Drug use: No  . Sexual activity: Not on file   Other Topics Concern  . Not on file   Social History Narrative   Widowed   Lives with daughter, husband and three grandchildren    [facility] to [facility] to the [employer] and work out.    Two cats as pets      Past Surgical History:  Procedure Laterality Date  . APPENDECTOMY      Family History  Problem Relation Age of Onset  . Cancer Mother        throat  . Diabetes Father   . Heart disease Father   . Heart attack Father     No Known Allergies  Current Outpatient Prescriptions on File Prior to Visit  Medication Sig Dispense Refill  . multivitamin (THERAGRAN) per tablet Take 1 tablet by mouth daily.     No current facility-administered medications on file prior to visit.     BP (!) 162/70 (BP Location: Left Arm)   Temp 97.7 F (36.5 C) (Oral)   Ht 5\' 2"  (1.575 m)  Wt 133 lb (60.3 kg)   BMI 24.33 kg/m       Objective:   Physical Exam  Constitutional: She is oriented to person, place, and time. She appears well-developed and well-nourished. No distress.  HENT:  Head: Normocephalic and atraumatic.  Right Ear: External ear normal.  Left Ear: External ear normal.  Nose: Nose normal.  Mouth/Throat: Oropharynx is clear and moist. No oropharyngeal exudate.  Eyes: Pupils are equal, round, and reactive to light. Conjunctivae and EOM are normal. Right eye exhibits no discharge. Left eye exhibits no discharge. No scleral icterus.  Neck: Normal range of motion. Neck supple. No JVD present. No tracheal deviation present.  No thyromegaly present.  Cardiovascular: Normal rate, regular rhythm, normal heart sounds and intact distal pulses.  Exam reveals no gallop and no friction rub.   No murmur heard. Pulmonary/Chest: Effort normal and breath sounds normal. No stridor. No respiratory distress. She has no wheezes. She has no rales. She exhibits no tenderness.  Abdominal: Soft. Bowel sounds are normal. She exhibits no distension and no mass. There is no tenderness. There is no rebound and no guarding.  Musculoskeletal: Normal range of motion. She exhibits no edema, tenderness or deformity.  Lymphadenopathy:    She has no cervical adenopathy.  Neurological: She is alert and oriented to person, place, and time. She has normal reflexes. She displays normal reflexes. No cranial nerve deficit. She exhibits normal muscle tone. Coordination normal.  Skin: Skin is warm and dry. No rash noted. She is not diaphoretic. No erythema. No pallor.  Psychiatric: She has a normal mood and affect. Her behavior is normal. Judgment and thought content normal.  Nursing note and vitals reviewed.     Assessment & Plan:  1. Routine general medical examination at a health care facility - Benign exam  - Continue to exercise and eat healthy  - Follow up in one year or sooner if needed - Basic metabolic panel - CBC with Differential/Platelet - Hepatic function panel - Lipid panel - TSH  2. Hyperlipidemia, unspecified hyperlipidemia type - Consider changing lipitor dose  - Basic metabolic panel - CBC with Differential/Platelet - Hepatic function panel - Lipid panel - TSH - atorvastatin (LIPITOR) 20 MG tablet; TAKE 1 TABLET BY MOUTH EVERY DAY  Dispense: 90 tablet; Refill: 3  3. Essential hypertension - Well controlled at home.  - No change in medication at this time  - Basic metabolic panel - CBC with Differential/Platelet - Hepatic function panel - Lipid panel - TSH - lisinopril (PRINIVIL,ZESTRIL) 5 MG tablet; Take 1 tablet (5  mg total) by mouth daily.  Dispense: 90 tablet; Refill: 3   Dorothyann Peng, NP

## 2017-09-11 ENCOUNTER — Other Ambulatory Visit: Payer: Self-pay | Admitting: Adult Health

## 2017-09-11 DIAGNOSIS — I10 Essential (primary) hypertension: Secondary | ICD-10-CM

## 2017-09-11 DIAGNOSIS — E785 Hyperlipidemia, unspecified: Secondary | ICD-10-CM

## 2017-09-11 NOTE — Telephone Encounter (Signed)
DUPLICATE REQUEST.  SENT IN ON 09/02/2017.  MESSAGE SENT TO CHECK FILE.

## 2017-10-09 ENCOUNTER — Other Ambulatory Visit: Payer: Self-pay | Admitting: Adult Health

## 2017-10-09 DIAGNOSIS — Z139 Encounter for screening, unspecified: Secondary | ICD-10-CM

## 2017-11-10 ENCOUNTER — Ambulatory Visit
Admission: RE | Admit: 2017-11-10 | Discharge: 2017-11-10 | Disposition: A | Discharge status: Home / Self Care | Payer: Medicare Other | Source: Ambulatory Visit | Attending: Adult Health | Admitting: Adult Health

## 2017-11-10 DIAGNOSIS — Z1231 Encounter for screening mammogram for malignant neoplasm of breast: Secondary | ICD-10-CM | POA: Diagnosis not present

## 2017-11-10 DIAGNOSIS — Z139 Encounter for screening, unspecified: Secondary | ICD-10-CM

## 2017-11-17 ENCOUNTER — Telehealth: Payer: Self-pay | Admitting: Adult Health

## 2017-11-17 NOTE — Telephone Encounter (Signed)
The patient needs a letter stating that she is able to live independently.   Example from patient:  From: [place]  To: [facility]  To whom it may concern,  Gabriela Gomez, in my opinion, is able to live independently.   Call patient to pick up letter.

## 2017-11-25 ENCOUNTER — Encounter: Payer: Self-pay | Admitting: Family Medicine

## 2017-11-25 NOTE — Telephone Encounter (Signed)
Left a message for a return call.  Pt needs to be notified to pick up letter at the front desk.

## 2017-11-25 NOTE — Telephone Encounter (Signed)
Ok to write note

## 2017-11-25 NOTE — Telephone Encounter (Signed)
Pt notified to pick up at the front desk. 

## 2018-02-17 DIAGNOSIS — D1801 Hemangioma of skin and subcutaneous tissue: Secondary | ICD-10-CM | POA: Diagnosis not present

## 2018-02-17 DIAGNOSIS — D225 Melanocytic nevi of trunk: Secondary | ICD-10-CM | POA: Diagnosis not present

## 2018-02-17 DIAGNOSIS — L821 Other seborrheic keratosis: Secondary | ICD-10-CM | POA: Diagnosis not present

## 2018-02-17 DIAGNOSIS — L814 Other melanin hyperpigmentation: Secondary | ICD-10-CM | POA: Diagnosis not present

## 2018-08-13 ENCOUNTER — Other Ambulatory Visit: Payer: Self-pay | Admitting: Adult Health

## 2018-08-13 DIAGNOSIS — I10 Essential (primary) hypertension: Secondary | ICD-10-CM

## 2018-08-14 ENCOUNTER — Other Ambulatory Visit: Payer: Self-pay | Admitting: Adult Health

## 2018-08-14 DIAGNOSIS — E785 Hyperlipidemia, unspecified: Secondary | ICD-10-CM

## 2018-08-27 ENCOUNTER — Ambulatory Visit (INDEPENDENT_AMBULATORY_CARE_PROVIDER_SITE_OTHER): Payer: Medicare Other

## 2018-08-27 DIAGNOSIS — Z23 Encounter for immunization: Secondary | ICD-10-CM | POA: Diagnosis not present

## 2018-09-03 ENCOUNTER — Ambulatory Visit (INDEPENDENT_AMBULATORY_CARE_PROVIDER_SITE_OTHER): Payer: Medicare Other | Admitting: Adult Health

## 2018-09-03 ENCOUNTER — Encounter: Payer: Self-pay | Admitting: Adult Health

## 2018-09-03 VITALS — BP 144/80 | Temp 98.4°F | Ht 63.0 in | Wt 135.0 lb

## 2018-09-03 DIAGNOSIS — Z780 Asymptomatic menopausal state: Secondary | ICD-10-CM

## 2018-09-03 DIAGNOSIS — I10 Essential (primary) hypertension: Secondary | ICD-10-CM | POA: Diagnosis not present

## 2018-09-03 DIAGNOSIS — Z0000 Encounter for general adult medical examination without abnormal findings: Secondary | ICD-10-CM | POA: Diagnosis not present

## 2018-09-03 DIAGNOSIS — E785 Hyperlipidemia, unspecified: Secondary | ICD-10-CM

## 2018-09-03 LAB — CBC WITH DIFFERENTIAL/PLATELET
BASOS ABS: 0 10*3/uL (ref 0.0–0.1)
Basophils Relative: 0.9 % (ref 0.0–3.0)
Eosinophils Absolute: 0.2 10*3/uL (ref 0.0–0.7)
Eosinophils Relative: 4.4 % (ref 0.0–5.0)
HCT: 43.3 % (ref 36.0–46.0)
HEMOGLOBIN: 14.8 g/dL (ref 12.0–15.0)
Lymphocytes Relative: 39.6 % (ref 12.0–46.0)
Lymphs Abs: 2.1 10*3/uL (ref 0.7–4.0)
MCHC: 34.1 g/dL (ref 30.0–36.0)
MCV: 93.3 fl (ref 78.0–100.0)
MONOS PCT: 6.7 % (ref 3.0–12.0)
Monocytes Absolute: 0.4 10*3/uL (ref 0.1–1.0)
NEUTROS PCT: 48.4 % (ref 43.0–77.0)
Neutro Abs: 2.6 10*3/uL (ref 1.4–7.7)
Platelets: 184 10*3/uL (ref 150.0–400.0)
RBC: 4.64 Mil/uL (ref 3.87–5.11)
RDW: 13.6 % (ref 11.5–15.5)
WBC: 5.3 10*3/uL (ref 4.0–10.5)

## 2018-09-03 LAB — HEPATIC FUNCTION PANEL
ALBUMIN: 4.4 g/dL (ref 3.5–5.2)
ALK PHOS: 70 U/L (ref 39–117)
ALT: 19 U/L (ref 0–35)
AST: 21 U/L (ref 0–37)
BILIRUBIN TOTAL: 0.7 mg/dL (ref 0.2–1.2)
Bilirubin, Direct: 0.1 mg/dL (ref 0.0–0.3)
Total Protein: 7.2 g/dL (ref 6.0–8.3)

## 2018-09-03 LAB — BASIC METABOLIC PANEL
BUN: 21 mg/dL (ref 6–23)
CHLORIDE: 104 meq/L (ref 96–112)
CO2: 31 mEq/L (ref 19–32)
Calcium: 9.6 mg/dL (ref 8.4–10.5)
Creatinine, Ser: 0.7 mg/dL (ref 0.40–1.20)
GFR: 85.54 mL/min (ref >60.00)
GLUCOSE: 95 mg/dL (ref 70–99)
Potassium: 4.4 mEq/L (ref 3.5–5.1)
Sodium: 142 mEq/L (ref 135–145)

## 2018-09-03 LAB — LIPID PANEL
Cholesterol: 156 mg/dL (ref 0–200)
HDL: 57.2 mg/dL (ref >39.00)
LDL CALC: 85 mg/dL (ref 0–99)
NONHDL: 98.7
Total CHOL/HDL Ratio: 3
Triglycerides: 68 mg/dL (ref 0.0–149.0)
VLDL: 13.6 mg/dL (ref 0.0–40.0)

## 2018-09-03 LAB — TSH: TSH: 2.6 u[IU]/mL (ref 0.35–4.50)

## 2018-09-03 MED ORDER — LISINOPRIL 5 MG PO TABS: 5.0000 mg | ORAL_TABLET | Freq: Every day | ORAL | 3 refills | Status: DC

## 2018-09-03 MED ORDER — ATORVASTATIN CALCIUM 20 MG PO TABS: 20.0000 mg | ORAL_TABLET | Freq: Every day | ORAL | 3 refills | Status: DC

## 2018-09-03 NOTE — Progress Notes (Signed)
Subjective:    Patient ID: Gabriela Gomez, female    DOB: 1935/11/12, 82 y.o.   MRN: [id number]  HPI Patient presents for yearly preventative medicine examination. She is a pleasant 82 year old female who  has a past medical history of Adenomatous colon polyp, Allergy, Arthritis, Basal cell carcinoma (2017), and Hyperlipidemia.  Essential Hypertension - Takes Lisinopril 5 mg.  She monitors her blood pressure at home and he consistently has readings in the 120s to 130s over 80s.  Blood pressure increases when she comes to the office BP Readings from Last 3 Encounters:  09/03/18 (!) 144/80  09/02/17 (!) 162/70  08/29/16 (!) 160/62   Hyperlipidemia - Lipitor 20 mg  Lab Results  Component Value Date   CHOL 167 09/02/2017   HDL 62.50 09/02/2017   LDLCALC 92 09/02/2017   LDLDIRECT 139.1 08/19/2007   TRIG 63.0 09/02/2017   CHOLHDL 3 09/02/2017    All immunizations and health maintenance protocols were reviewed with the patient and needed orders were placed.  Appropriate screening laboratory values were ordered for the patient including screening of hyperlipidemia, renal function and hepatic function.  Medication reconciliation,  past medical history, social history, problem list and allergies were reviewed in detail with the patient  Goals were established with regard to weight loss, exercise, and  diet in compliance with medications. She continues to stay active and eat healthy   End of life planning was discussed. She has an advanced directive and living will.   He continues to get yearly mammograms and does monthly self breast exams at home.  Is any changes to her breasts.  She is due for a bone density screen, her last was in 2016 which was normal.  She does not do routine dental or vision screen  Review of Systems  Constitutional: Negative.   HENT: Negative.   Eyes: Negative.   Respiratory: Negative.   Cardiovascular: Negative.   Gastrointestinal: Negative.   Endocrine:  Negative.   Genitourinary: Negative.   Musculoskeletal: Negative.   Skin: Negative.   Allergic/Immunologic: Negative.   Neurological: Negative.   Hematological: Negative.   Psychiatric/Behavioral: Negative.    Past Medical History:  Diagnosis Date  . Adenomatous colon polyp   . Allergy   . Arthritis   . Basal cell carcinoma 2017   Left upper lip & right Ala Nasi  . Hyperlipidemia     Social History   Socioeconomic History  . Marital status: Widowed    Spouse name: Not on file  . Number of children: Not on file  . Years of education: Not on file  . Highest education level: Not on file  Occupational History  . Not on file  Social Needs  . Financial resource strain: Not on file  . Food insecurity:    Worry: Not on file    Inability: Not on file  . Transportation needs:    Medical: Not on file    Non-medical: Not on file  Tobacco Use  . Smoking status: Never Smoker  . Smokeless tobacco: Never Used  Substance and Sexual Activity  . Alcohol use: No    Alcohol/week: 0.0 standard drinks  . Drug use: No  . Sexual activity: Not on file  Lifestyle  . Physical activity:    Days per week: Not on file    Minutes per session: Not on file  . Stress: Not on file  Relationships  . Social connections:    Talks on phone: Not on file  Gets together: Not on file    Attends religious service: Not on file    Active member of club or organization: Not on file    Attends meetings of clubs or organizations: Not on file    Relationship status: Not on file  . Intimate partner violence:    Fear of current or ex partner: Not on file    Emotionally abused: Not on file    Physically abused: Not on file    Forced sexual activity: Not on file  Other Topics Concern  . Not on file  Social History Narrative   Widowed   Lives with daughter, husband and three grandchildren    [facility] and work out.    Two cats as pets      Past Surgical History:  Procedure Laterality  Date  . APPENDECTOMY      Family History  Problem Relation Age of Onset  . Cancer Mother        throat  . Diabetes Father   . Heart disease Father   . Heart attack Father     No Known Allergies  Current Outpatient Medications on File Prior to Visit  Medication Sig Dispense Refill  . multivitamin (THERAGRAN) per tablet Take 1 tablet by mouth daily.     No current facility-administered medications on file prior to visit.     BP (!) 144/80   Temp 98.4 F (36.9 C)   Ht 5\' 3"  (1.6 m)   Wt 135 lb (61.2 kg)   BMI 23.91 kg/m       Objective:   Physical Exam  Constitutional: She is oriented to person, place, and time. She appears well-developed and well-nourished. No distress.  HENT:  Head: Normocephalic and atraumatic.  Right Ear: External ear normal.  Left Ear: External ear normal.  Nose: Nose normal.  Mouth/Throat: Oropharynx is clear and moist. No oropharyngeal exudate.  Eyes: Pupils are equal, round, and reactive to light. Conjunctivae and EOM are normal. Right eye exhibits no discharge. Left eye exhibits no discharge. No scleral icterus.  Neck: Normal range of motion. Neck supple. No JVD present. No tracheal deviation present. No thyromegaly present.  Cardiovascular: Normal rate, regular rhythm, normal heart sounds and intact distal pulses. Exam reveals no gallop and no friction rub.  No murmur heard. Pulmonary/Chest: Effort normal and breath sounds normal. No stridor. No respiratory distress. She has no wheezes. She has no rales. She exhibits no tenderness.  Abdominal: Soft. Bowel sounds are normal. She exhibits no distension and no mass. There is no tenderness. There is no rebound and no guarding. No hernia.  Musculoskeletal: Normal range of motion. She exhibits no edema, tenderness or deformity.  Lymphadenopathy:    She has no cervical adenopathy.  Neurological: She is alert and oriented to person, place, and time. She displays normal reflexes. No cranial nerve  deficit or sensory deficit. She exhibits normal muscle tone. Coordination normal.  Skin: Skin is warm and dry. Capillary refill takes less than 2 seconds. No rash noted. She is not diaphoretic. No erythema. No pallor.  Psychiatric: She has a normal mood and affect. Her behavior is normal. Judgment and thought content normal.  Nursing note and vitals reviewed.     Assessment & Plan:  1. Routine general medical examination at a health care facility - Benign exam  - Continue to exercise and eat healthy - Follow up in one year or sooner if needed - Basic metabolic panel - CBC with  Differential/Platelet - Hepatic function panel - Lipid panel - TSH  2. Hyperlipidemia, unspecified hyperlipidemia type - Consider changing statin dose  - Basic metabolic panel - CBC with Differential/Platelet - Hepatic function panel - Lipid panel - TSH - atorvastatin (LIPITOR) 20 MG tablet; Take 1 tablet (20 mg total) by mouth daily.  Dispense: 90 tablet; Refill: 3  3. Essential hypertension - Well controlled at home.  - No change in medications  - Basic metabolic panel - CBC with Differential/Platelet - Hepatic function panel - Lipid panel - TSH - lisinopril (PRINIVIL,ZESTRIL) 5 MG tablet; Take 1 tablet (5 mg total) by mouth daily.  Dispense: 90 tablet; Refill: 3  4. Post-menopausal  - DG Bone Density; Future   Dorothyann Peng, NP

## 2018-09-22 DIAGNOSIS — Z85828 Personal history of other malignant neoplasm of skin: Secondary | ICD-10-CM | POA: Diagnosis not present

## 2018-09-22 DIAGNOSIS — B351 Tinea unguium: Secondary | ICD-10-CM | POA: Diagnosis not present

## 2018-09-22 DIAGNOSIS — D225 Melanocytic nevi of trunk: Secondary | ICD-10-CM | POA: Diagnosis not present

## 2018-09-22 DIAGNOSIS — L814 Other melanin hyperpigmentation: Secondary | ICD-10-CM | POA: Diagnosis not present

## 2018-10-12 ENCOUNTER — Other Ambulatory Visit: Payer: Self-pay | Admitting: Adult Health

## 2018-10-12 DIAGNOSIS — Z1231 Encounter for screening mammogram for malignant neoplasm of breast: Secondary | ICD-10-CM

## 2018-10-14 ENCOUNTER — Other Ambulatory Visit: Payer: Self-pay | Admitting: Family Medicine

## 2018-10-14 DIAGNOSIS — Z13820 Encounter for screening for osteoporosis: Secondary | ICD-10-CM

## 2018-10-14 DIAGNOSIS — Z780 Asymptomatic menopausal state: Principal | ICD-10-CM

## 2018-11-17 ENCOUNTER — Ambulatory Visit
Admission: RE | Admit: 2018-11-17 | Discharge: 2018-11-17 | Disposition: A | Discharge status: Home / Self Care | Payer: Medicare Other | Source: Ambulatory Visit | Attending: Adult Health | Admitting: Adult Health

## 2018-11-17 DIAGNOSIS — R69 Illness, unspecified: Secondary | ICD-10-CM | POA: Diagnosis not present

## 2018-11-17 DIAGNOSIS — Z1231 Encounter for screening mammogram for malignant neoplasm of breast: Secondary | ICD-10-CM

## 2019-08-17 ENCOUNTER — Ambulatory Visit (INDEPENDENT_AMBULATORY_CARE_PROVIDER_SITE_OTHER): Payer: Medicare Other

## 2019-08-17 ENCOUNTER — Other Ambulatory Visit: Payer: Self-pay

## 2019-08-17 DIAGNOSIS — Z23 Encounter for immunization: Secondary | ICD-10-CM | POA: Diagnosis not present

## 2019-09-07 ENCOUNTER — Encounter: Payer: Medicare Other | Admitting: Adult Health

## 2019-09-21 DIAGNOSIS — D1801 Hemangioma of skin and subcutaneous tissue: Secondary | ICD-10-CM | POA: Diagnosis not present

## 2019-09-21 DIAGNOSIS — D225 Melanocytic nevi of trunk: Secondary | ICD-10-CM | POA: Diagnosis not present

## 2019-09-21 DIAGNOSIS — L821 Other seborrheic keratosis: Secondary | ICD-10-CM | POA: Diagnosis not present

## 2019-09-21 DIAGNOSIS — L718 Other rosacea: Secondary | ICD-10-CM | POA: Diagnosis not present

## 2019-09-24 ENCOUNTER — Encounter: Payer: Self-pay | Admitting: Adult Health

## 2019-09-24 ENCOUNTER — Other Ambulatory Visit: Payer: Self-pay

## 2019-09-24 ENCOUNTER — Ambulatory Visit (INDEPENDENT_AMBULATORY_CARE_PROVIDER_SITE_OTHER): Payer: Medicare Other | Admitting: Adult Health

## 2019-09-24 VITALS — BP 160/70 | Temp 97.7°F | Ht 63.0 in | Wt 137.0 lb

## 2019-09-24 DIAGNOSIS — Z0000 Encounter for general adult medical examination without abnormal findings: Secondary | ICD-10-CM

## 2019-09-24 DIAGNOSIS — K649 Unspecified hemorrhoids: Secondary | ICD-10-CM

## 2019-09-24 DIAGNOSIS — E2839 Other primary ovarian failure: Secondary | ICD-10-CM

## 2019-09-24 DIAGNOSIS — I10 Essential (primary) hypertension: Secondary | ICD-10-CM | POA: Diagnosis not present

## 2019-09-24 DIAGNOSIS — E785 Hyperlipidemia, unspecified: Secondary | ICD-10-CM | POA: Diagnosis not present

## 2019-09-24 LAB — LIPID PANEL
Cholesterol: 180 mg/dL (ref 0–200)
HDL: 57.7 mg/dL (ref >39.00)
LDL Cholesterol: 107 mg/dL — ABNORMAL HIGH (ref 0–99)
NonHDL: 122.65
Total CHOL/HDL Ratio: 3
Triglycerides: 77 mg/dL (ref 0.0–149.0)
VLDL: 15.4 mg/dL (ref 0.0–40.0)

## 2019-09-24 LAB — CBC WITH DIFFERENTIAL/PLATELET
Basophils Absolute: 0 10*3/uL (ref 0.0–0.1)
Basophils Relative: 0.8 % (ref 0.0–3.0)
Eosinophils Absolute: 0.1 10*3/uL (ref 0.0–0.7)
Eosinophils Relative: 1.6 % (ref 0.0–5.0)
HCT: 43 % (ref 36.0–46.0)
Hemoglobin: 14.4 g/dL (ref 12.0–15.0)
Lymphocytes Relative: 35.9 % (ref 12.0–46.0)
Lymphs Abs: 1.9 10*3/uL (ref 0.7–4.0)
MCHC: 33.4 g/dL (ref 30.0–36.0)
MCV: 93.3 fl (ref 78.0–100.0)
Monocytes Absolute: 0.3 10*3/uL (ref 0.1–1.0)
Monocytes Relative: 6.2 % (ref 3.0–12.0)
Neutro Abs: 2.9 10*3/uL (ref 1.4–7.7)
Neutrophils Relative %: 55.5 % (ref 43.0–77.0)
Platelets: 212 10*3/uL (ref 150.0–400.0)
RBC: 4.61 Mil/uL (ref 3.87–5.11)
RDW: 13.8 % (ref 11.5–15.5)
WBC: 5.2 10*3/uL (ref 4.0–10.5)

## 2019-09-24 LAB — COMPREHENSIVE METABOLIC PANEL
ALT: 18 U/L (ref 0–35)
AST: 25 U/L (ref 0–37)
Albumin: 4.6 g/dL (ref 3.5–5.2)
Alkaline Phosphatase: 73 U/L (ref 39–117)
BUN: 18 mg/dL (ref 6–23)
CO2: 31 mEq/L (ref 19–32)
Calcium: 9.8 mg/dL (ref 8.4–10.5)
Chloride: 105 mEq/L (ref 96–112)
Creatinine, Ser: 0.64 mg/dL (ref 0.40–1.20)
GFR: 89.01 mL/min (ref >60.00)
Glucose, Bld: 109 mg/dL — ABNORMAL HIGH (ref 70–99)
Potassium: 4.5 mEq/L (ref 3.5–5.1)
Sodium: 144 mEq/L (ref 135–145)
Total Bilirubin: 0.7 mg/dL (ref 0.2–1.2)
Total Protein: 7.5 g/dL (ref 6.0–8.3)

## 2019-09-24 LAB — TSH: TSH: 2.64 u[IU]/mL (ref 0.35–4.50)

## 2019-09-24 LAB — VITAMIN D 25 HYDROXY (VIT D DEFICIENCY, FRACTURES): VITD: 36.51 ng/mL (ref 30.00–100.00)

## 2019-09-24 MED ORDER — HYDROCORTISONE ACETATE 25 MG RE SUPP: 25.0000 mg | Freq: Two times a day (BID) | RECTAL | 6 refills | Status: DC

## 2019-09-24 NOTE — Progress Notes (Signed)
Subjective:    Patient ID: Gabriela Gomez, female    DOB: May 24, 1936, 83 y.o.   MRN: [id number]  HPI Patient presents for yearly preventative medicine examination. She is a pleasant 83 year old female who  has a past medical history of Adenomatous colon polyp, Allergy, Arthritis, Basal cell carcinoma (2017), and Hyperlipidemia.  Essential Hypertension -takes lisinopril 5 mg daily.  She does monitor her blood pressure at home and consistently has readings in the 120s to 130s over 80s.  She often has issues with elevated blood pressure in the office.  She denies dizziness, lightheadedness, chest pain, shortness of breath, or syncopal episodes. BP Readings from Last 3 Encounters:  09/24/19 (!) 160/70  09/03/18 (!) 144/80  09/02/17 (!) 162/70   Hyperlipidemia -currently prescribed Lipitor 20 mg daily.  She denies fatigue or myalgia. Lab Results  Component Value Date   CHOL 156 09/03/2018   HDL 57.20 09/03/2018   LDLCALC 85 09/03/2018   LDLDIRECT 139.1 08/19/2007   TRIG 68.0 09/03/2018   CHOLHDL 3 09/03/2018   Hemorrhoids - this is her biggest complaint today. She has intermittent episodes of burning and bleeding after having a hard bowel movement. She has a history of hemorrhoids. Would like to have a suppository   All immunizations and health maintenance protocols were reviewed with the patient and needed orders were placed. She is  Up to date on routine vaccinations   Appropriate screening laboratory values were ordered for the patient including screening of hyperlipidemia, renal function and hepatic function.  Medication reconciliation,  past medical history, social history, problem list and allergies were reviewed in detail with the patient  Goals were established with regard to weight loss, exercise, and  diet in compliance with medications. She continues to stay active but misses going to the gym. She is eating healthy  End of life planning was discussed.  She has an advanced  directive and living will.  She continues to get yearly mammograms and does monthly self breast exams at home.  She denies changes to her breasts.She is due for bone density screen.    Review of Systems  Constitutional: Negative.   HENT: Negative.   Eyes: Negative.   Respiratory: Negative.   Cardiovascular: Negative.   Gastrointestinal: Negative.   Endocrine: Negative.   Genitourinary: Negative.   Musculoskeletal: Negative.   Skin: Negative.   Allergic/Immunologic: Negative.   Neurological: Negative.   Hematological: Negative.   Psychiatric/Behavioral: Negative.    Past Medical History:  Diagnosis Date  . Adenomatous colon polyp   . Allergy   . Arthritis   . Basal cell carcinoma 2017   Left upper lip & right Ala Nasi  . Hyperlipidemia     Social History   Socioeconomic History  . Marital status: Widowed    Spouse name: Not on file  . Number of children: Not on file  . Years of education: Not on file  . Highest education level: Not on file  Occupational History  . Not on file  Social Needs  . Financial resource strain: Not on file  . Food insecurity    Worry: Not on file    Inability: Not on file  . Transportation needs    Medical: Not on file    Non-medical: Not on file  Tobacco Use  . Smoking status: Never Smoker  . Smokeless tobacco: Never Used  Substance and Sexual Activity  . Alcohol use: No    Alcohol/week: 0.0 standard drinks  . Drug use: No  .  Sexual activity: Not on file  Lifestyle  . Physical activity    Days per week: Not on file    Minutes per session: Not on file  . Stress: Not on file  Relationships  . Social Herbalist on phone: Not on file    Gets together: Not on file    Attends religious service: Not on file    Active member of club or organization: Not on file    Attends meetings of clubs or organizations: Not on file    Relationship status: Not on file  . Intimate partner violence    Fear of current or ex partner: Not  on file    Emotionally abused: Not on file    Physically abused: Not on file    Forced sexual activity: Not on file  Other Topics Concern  . Not on file  Social History Narrative   Widowed   Lives with daughter, husband and three grandchildren    [facility] to [facility] to the [employer] and work out.    Two cats as pets      Past Surgical History:  Procedure Laterality Date  . APPENDECTOMY      Family History  Problem Relation Age of Onset  . Cancer Mother        throat  . Diabetes Father   . Heart disease Father   . Heart attack Father     No Known Allergies  Current Outpatient Medications on File Prior to Visit  Medication Sig Dispense Refill  . atorvastatin (LIPITOR) 20 MG tablet Take 1 tablet (20 mg total) by mouth daily. 90 tablet 3  . lisinopril (PRINIVIL,ZESTRIL) 5 MG tablet Take 1 tablet (5 mg total) by mouth daily. 90 tablet 3  . multivitamin (THERAGRAN) per tablet Take 1 tablet by mouth daily.     No current facility-administered medications on file prior to visit.     BP (!) 160/70   Temp 97.7 F (36.5 C)   Ht 5\' 3"  (1.6 m)   Wt 137 lb (62.1 kg)   BMI 24.27 kg/m       Objective:   Physical Exam Vitals signs and nursing note reviewed.  Constitutional:      Appearance: Normal appearance.  HENT:     Head: Normocephalic.     Right Ear: Tympanic membrane, ear canal and external ear normal. There is no impacted cerumen.     Left Ear: Tympanic membrane, ear canal and external ear normal. There is no impacted cerumen.     Nose: Nose normal. No congestion or rhinorrhea.     Mouth/Throat:     Mouth: Mucous membranes are moist.     Dentition: Has dentures.  Eyes:     Extraocular Movements: Extraocular movements intact.     Pupils: Pupils are equal, round, and reactive to light.  Cardiovascular:     Rate and Rhythm: Normal rate and regular rhythm.     Pulses: Normal pulses.     Heart sounds: Normal heart sounds. No murmur. No friction rub. No gallop.   Pulmonary:      Effort: Pulmonary effort is normal. No respiratory distress.     Breath sounds: Normal breath sounds. No stridor. No wheezing, rhonchi or rales.  Chest:     Chest wall: No tenderness.  Abdominal:     General: Abdomen is flat. Bowel sounds are normal. There is no distension.     Palpations: Abdomen is soft. There is no mass.  Tenderness: There is no abdominal tenderness. There is no right CVA tenderness, left CVA tenderness, guarding or rebound.     Hernia: No hernia is present.  Musculoskeletal: Normal range of motion.        General: No swelling, tenderness, deformity or signs of injury.     Right lower leg: No edema.     Left lower leg: No edema.  Skin:    General: Skin is warm and dry.     Capillary Refill: Capillary refill takes less than 2 seconds.     Coloration: Skin is not jaundiced or pale.     Findings: No bruising, erythema, lesion or rash.  Neurological:     General: No focal deficit present.     Mental Status: She is alert and oriented to person, place, and time.     Cranial Nerves: No cranial nerve deficit.     Sensory: No sensory deficit.     Motor: No weakness.     Coordination: Coordination normal.     Gait: Gait normal.     Deep Tendon Reflexes: Reflexes normal.  Psychiatric:        Mood and Affect: Mood normal.        Behavior: Behavior normal.        Thought Content: Thought content normal.        Judgment: Judgment normal.       Assessment & Plan:  1. Routine general medical examination at a health care facility - Continue to stay active and exercise. She is a remarkable 83 year old.  - Follow up in one year or sooner if needed - CBC with Differential/Platelet - CMP - Lipid panel - TSH  2. Essential hypertension - Better controlled at home. No change in medications  - CBC with Differential/Platelet - CMP - Lipid panel - TSH  3. Hyperlipidemia, unspecified hyperlipidemia type - Consider increase in statin  - CBC with Differential/Platelet -  CMP - Lipid panel - TSH  4. Hemorrhoids, unspecified hemorrhoid type - Add Benifiber to diet daily.  - hydrocortisone (ANUSOL-HC) 25 MG suppository; Place 1 suppository (25 mg total) rectally 2 (two) times daily.  Dispense: 12 suppository; Refill: 6  5. Estrogen deficiency  - DG Bone Density; Future - Vitamin D, 25-hydroxy   Dorothyann Peng, NP

## 2019-09-24 NOTE — Patient Instructions (Signed)
It was great seeing you today   We will follow up with you about your blood work   Schedule your bone density test at check out desk. You may also call directly to X-ray at [phone] to schedule an appointment that is convenient for you.  - located [address] across the [address] from [place] - in the basement - you do need an appointment for the bone density tests.   Marland Kitchen

## 2019-09-28 ENCOUNTER — Other Ambulatory Visit: Payer: Self-pay

## 2019-09-28 ENCOUNTER — Ambulatory Visit (INDEPENDENT_AMBULATORY_CARE_PROVIDER_SITE_OTHER)
Admission: RE | Admit: 2019-09-28 | Discharge: 2019-09-28 | Disposition: A | Discharge status: Home / Self Care | Payer: Medicare Other | Source: Ambulatory Visit | Attending: Adult Health | Admitting: Adult Health

## 2019-09-28 DIAGNOSIS — E2839 Other primary ovarian failure: Secondary | ICD-10-CM | POA: Diagnosis not present

## 2019-10-15 ENCOUNTER — Other Ambulatory Visit: Payer: Self-pay | Admitting: Adult Health

## 2019-10-15 DIAGNOSIS — Z1231 Encounter for screening mammogram for malignant neoplasm of breast: Secondary | ICD-10-CM

## 2019-10-28 ENCOUNTER — Other Ambulatory Visit: Payer: Self-pay | Admitting: Adult Health

## 2019-10-28 DIAGNOSIS — E785 Hyperlipidemia, unspecified: Secondary | ICD-10-CM

## 2019-10-28 DIAGNOSIS — I10 Essential (primary) hypertension: Secondary | ICD-10-CM

## 2019-11-21 ENCOUNTER — Ambulatory Visit: Payer: Medicare Other | Attending: Internal Medicine

## 2019-11-21 DIAGNOSIS — Z23 Encounter for immunization: Secondary | ICD-10-CM | POA: Insufficient documentation

## 2019-11-21 NOTE — Progress Notes (Signed)
   Covid-19 Vaccination Clinic  Name:  Gabriela Gomez    MRN: [id number] DOB: October 09, 1936  11/21/2019  [name] was observed post Covid-19 immunization for 15 minutes without incidence. She was provided with Vaccine Information Sheet and instruction to access the V-Safe system.   [name] was instructed to call 911 with any severe reactions post vaccine: Marland Kitchen Difficulty breathing  . Swelling of your face and throat  . A fast heartbeat  . A bad rash all over your body  . Dizziness and weakness    Immunizations Administered    Name Date Dose VIS Date Route   Pfizer COVID-19 Vaccine 11/21/2019 12:51 PM 0.3 mL 10/22/2019 Intramuscular   Manufacturer: [place]   Lot: [id number]   [place]: [id number]

## 2019-12-07 ENCOUNTER — Other Ambulatory Visit: Payer: Self-pay

## 2019-12-07 ENCOUNTER — Ambulatory Visit
Admission: RE | Admit: 2019-12-07 | Discharge: 2019-12-07 | Disposition: A | Discharge status: Home / Self Care | Payer: Medicare Other | Source: Ambulatory Visit | Attending: Adult Health | Admitting: Adult Health

## 2019-12-07 DIAGNOSIS — Z1231 Encounter for screening mammogram for malignant neoplasm of breast: Secondary | ICD-10-CM | POA: Diagnosis not present

## 2019-12-08 ENCOUNTER — Ambulatory Visit: Payer: Medicare Other

## 2019-12-12 ENCOUNTER — Ambulatory Visit: Payer: Medicare Other | Attending: Internal Medicine

## 2019-12-12 DIAGNOSIS — Z23 Encounter for immunization: Secondary | ICD-10-CM

## 2019-12-12 NOTE — Progress Notes (Signed)
   Covid-19 Vaccination Clinic  Name:  Gabriela Gomez    MRN: [id number] DOB: 27-Oct-1936  12/12/2019  Gabriela Gomez was observed post Covid-19 immunization for 15 minutes without incidence. She was provided with Vaccine Information Sheet and instruction to access the V-Safe system.   Gabriela Gomez was instructed to call 911 with any severe reactions post vaccine: Marland Kitchen Difficulty breathing  . Swelling of your face and throat  . A fast heartbeat  . A bad rash all over your body  . Dizziness and weakness    Immunizations Administered    Name Date Dose VIS Date Route   Pfizer COVID-19 Vaccine 12/12/2019 12:13 PM 0.3 mL 10/22/2019 Intramuscular   Manufacturer: [place]   Lot: [id number]   [place]: [id number]

## 2020-09-07 ENCOUNTER — Telehealth: Payer: Self-pay | Admitting: Adult Health

## 2020-09-07 NOTE — Telephone Encounter (Signed)
Left message for patient to schedule Annual Wellness Visit.  Please schedule with Nurse Health Advisor Shannon Crews, RN at [facility]  

## 2020-09-07 NOTE — Telephone Encounter (Signed)
Pt declined to make an appointment at this time.

## 2020-09-20 DIAGNOSIS — D1801 Hemangioma of skin and subcutaneous tissue: Secondary | ICD-10-CM | POA: Diagnosis not present

## 2020-09-20 DIAGNOSIS — L821 Other seborrheic keratosis: Secondary | ICD-10-CM | POA: Diagnosis not present

## 2020-09-20 DIAGNOSIS — L814 Other melanin hyperpigmentation: Secondary | ICD-10-CM | POA: Diagnosis not present

## 2020-09-20 DIAGNOSIS — D225 Melanocytic nevi of trunk: Secondary | ICD-10-CM | POA: Diagnosis not present

## 2020-09-26 ENCOUNTER — Encounter: Payer: Self-pay | Admitting: Adult Health

## 2020-09-26 ENCOUNTER — Other Ambulatory Visit: Payer: Self-pay

## 2020-09-26 ENCOUNTER — Ambulatory Visit (INDEPENDENT_AMBULATORY_CARE_PROVIDER_SITE_OTHER): Payer: Medicare Other | Admitting: Adult Health

## 2020-09-26 VITALS — BP 120/80 | HR 71 | Temp 97.6°F | Ht 63.0 in | Wt 116.6 lb

## 2020-09-26 DIAGNOSIS — I10 Essential (primary) hypertension: Secondary | ICD-10-CM | POA: Diagnosis not present

## 2020-09-26 DIAGNOSIS — Z0000 Encounter for general adult medical examination without abnormal findings: Secondary | ICD-10-CM

## 2020-09-26 DIAGNOSIS — K649 Unspecified hemorrhoids: Secondary | ICD-10-CM | POA: Diagnosis not present

## 2020-09-26 DIAGNOSIS — E785 Hyperlipidemia, unspecified: Secondary | ICD-10-CM | POA: Diagnosis not present

## 2020-09-26 MED ORDER — HYDROCORTISONE ACETATE 25 MG RE SUPP: 25.0000 mg | Freq: Two times a day (BID) | RECTAL | 6 refills | Status: DC

## 2020-09-26 MED ORDER — ATORVASTATIN CALCIUM 20 MG PO TABS: 20.0000 mg | ORAL_TABLET | Freq: Every day | ORAL | 3 refills | Status: DC

## 2020-09-26 MED ORDER — LISINOPRIL 5 MG PO TABS: 5.0000 mg | ORAL_TABLET | Freq: Every day | ORAL | 3 refills | Status: DC

## 2020-09-26 NOTE — Patient Instructions (Signed)
It was great seeing you today   We will follow up with you regarding your blood work   Someone will call you to schedule your appointment with Gastroenterology   Please follow up with me in one year

## 2020-09-26 NOTE — Progress Notes (Signed)
Subjective:    Patient ID: Gabriela Gomez, female    DOB: 1936/10/27, 84 y.o.   MRN: [id number]  HPI Patient presents for yearly preventative medicine examination. She is a pleasant 84 year old female who  has a past medical history of Adenomatous colon polyp, Allergy, Arthritis, Basal cell carcinoma (2017), and Hyperlipidemia.  Essential Hypertension -lisinopril 5 mg daily.  She does monitor her blood pressure at home and consistently has readings in the 120s to 130s over 80s.  She often has issues with elevated blood pressure in the office.  She denies dizziness, lightheadedness, chest pain, shortness of breath, or syncopal episodes BP Readings from Last 3 Encounters:  09/26/20 120/80  09/24/19 (!) 160/70  09/03/18 (!) 144/80   Hyperlipidemia-is currently prescribed Lipitor 20 mg daily.  She denies myalgia or fatigue  Lab Results  Component Value Date   CHOL 180 09/24/2019   HDL 57.70 09/24/2019   LDLCALC 107 (H) 09/24/2019   LDLDIRECT 139.1 08/19/2007   TRIG 77.0 09/24/2019   CHOLHDL 3 09/24/2019   Hemorrhoids- long standing history has used Anusol suppositories in the past and worked well for the pain but she continues to have bright red blood when she wipes " nearly every time. She denies abd pain or blood in stool or in bowl.   History of skin cancer - is seen by dermatology on a yearly basis   All immunizations and health maintenance protocols were reviewed with the patient and needed orders were placed.  Appropriate screening laboratory values were ordered for the patient including screening of hyperlipidemia, renal function and hepatic function.   Medication reconciliation,  past medical history, social history, problem list and allergies were reviewed in detail with the patient  Goals were established with regard to weight loss, exercise, and  diet in compliance with medications.  She continues to stay active and eat a heart healthy diet Wt Readings from Last 3  Encounters:  09/26/20 116 lb 9.6 oz (52.9 kg)  09/24/19 137 lb (62.1 kg)  09/03/18 135 lb (61.2 kg)   End of life planning was discussed.  She has an advanced directive and living well  He continues to get mammograms and does monthly self breast exams at home.  She denies changes to her breast  Review of Systems  Constitutional: Negative.   HENT: Negative.   Eyes: Negative.   Respiratory: Negative.   Cardiovascular: Negative.   Gastrointestinal: Negative.   Endocrine: Negative.   Genitourinary: Negative.   Musculoskeletal: Negative.   Skin: Negative.   Allergic/Immunologic: Negative.   Neurological: Negative.   Hematological: Negative.   Psychiatric/Behavioral: Negative.    Past Medical History:  Diagnosis Date  . Adenomatous colon polyp   . Allergy   . Arthritis   . Basal cell carcinoma 2017   Left upper lip & right Ala Nasi  . Hyperlipidemia     Social History   Socioeconomic History  . Marital status: Widowed    Spouse name: Not on file  . Number of children: Not on file  . Years of education: Not on file  . Highest education level: Not on file  Occupational History  . Not on file  Tobacco Use  . Smoking status: Never Smoker  . Smokeless tobacco: Never Used  Substance and Sexual Activity  . Alcohol use: No    Alcohol/week: 0.0 standard drinks  . Drug use: No  . Sexual activity: Not on file  Other Topics Concern  . Not on file  Social History Narrative   Widowed   Lives with daughter, husband and three grandchildren    [facility] to [facility] [employer] and work out.    Two cats as pets     Social Determinants of Health   Financial Resource Strain:   . Difficulty of Paying Living Expenses: Not on file  Food Insecurity:   . Worried About Charity fundraiser in the Last Year: Not on file  . Ran Out of Food in the Last Year: Not on file  Transportation Needs:   . Lack of Transportation (Medical): Not on file  . Lack of Transportation (Non-Medical): Not on file    Physical Activity:   . Days of Exercise per Week: Not on file  . Minutes of Exercise per Session: Not on file  Stress:   . Feeling of Stress : Not on file  Social Connections:   . Frequency of Communication with Friends and Family: Not on file  . Frequency of Social Gatherings with Friends and Family: Not on file  . Attends Religious Services: Not on file  . Active Member of Clubs or Organizations: Not on file  . Attends Archivist Meetings: Not on file  . Marital Status: Not on file  Intimate Partner Violence:   . Fear of Current or Ex-Partner: Not on file  . Emotionally Abused: Not on file  . Physically Abused: Not on file  . Sexually Abused: Not on file    Past Surgical History:  Procedure Laterality Date  . APPENDECTOMY      Family History  Problem Relation Age of Onset  . Cancer Mother        throat  . Diabetes Father   . Heart disease Father   . Heart attack Father     No Known Allergies  Current Outpatient Medications on File Prior to Visit  Medication Sig Dispense Refill  . atorvastatin (LIPITOR) 20 MG tablet TAKE 1 TABLET BY MOUTH EVERY DAY 90 tablet 3  . hydrocortisone (ANUSOL-HC) 25 MG suppository Place 1 suppository (25 mg total) rectally 2 (two) times daily. 12 suppository 6  . lisinopril (ZESTRIL) 5 MG tablet TAKE 1 TABLET BY MOUTH EVERY DAY 90 tablet 3  . multivitamin (THERAGRAN) per tablet Take 1 tablet by mouth daily.     No current facility-administered medications on file prior to visit.    BP 120/80 (BP Location: Left Arm, Patient Position: Sitting, Cuff Size: Normal)   Pulse 71   Temp 97.6 F (36.4 C) (Oral)   Ht 5' 3"  (1.6 m)   Wt 116 lb 9.6 oz (52.9 kg)   BMI 20.65 kg/m       Objective:   Physical Exam Vitals and nursing note reviewed.  Constitutional:      General: She is not in acute distress.    Appearance: Normal appearance. She is well-developed. She is not ill-appearing.  HENT:     Head: Normocephalic and  atraumatic.     Right Ear: Tympanic membrane, ear canal and external ear normal. There is no impacted cerumen.     Left Ear: Tympanic membrane, ear canal and external ear normal. There is no impacted cerumen.     Nose: Nose normal. No congestion or rhinorrhea.     Mouth/Throat:     Mouth: Mucous membranes are moist.     Pharynx: Oropharynx is clear. No oropharyngeal exudate or posterior oropharyngeal erythema.  Eyes:     General:  Right eye: No discharge.        Left eye: No discharge.     Extraocular Movements: Extraocular movements intact.     Conjunctiva/sclera: Conjunctivae normal.     Pupils: Pupils are equal, round, and reactive to light.  Neck:     Thyroid: No thyromegaly.     Vascular: No carotid bruit.     Trachea: No tracheal deviation.  Cardiovascular:     Rate and Rhythm: Normal rate and regular rhythm.     Pulses: Normal pulses.     Heart sounds: Normal heart sounds. No murmur heard.  No friction rub. No gallop.   Pulmonary:     Effort: Pulmonary effort is normal. No respiratory distress.     Breath sounds: Normal breath sounds. No stridor. No wheezing, rhonchi or rales.  Chest:     Chest wall: No tenderness.  Abdominal:     General: Abdomen is flat. Bowel sounds are normal. There is no distension.     Palpations: Abdomen is soft. There is no mass.     Tenderness: There is no abdominal tenderness. There is no right CVA tenderness, left CVA tenderness, guarding or rebound.     Hernia: No hernia is present.  Musculoskeletal:        General: No swelling, tenderness, deformity or signs of injury. Normal range of motion.     Cervical back: Normal range of motion and neck supple.     Right lower leg: No edema.     Left lower leg: No edema.  Lymphadenopathy:     Cervical: No cervical adenopathy.  Skin:    General: Skin is warm and dry.     Coloration: Skin is not jaundiced or pale.     Findings: No bruising, erythema, lesion or rash.  Neurological:      General: No focal deficit present.     Mental Status: She is alert and oriented to person, place, and time.     Cranial Nerves: No cranial nerve deficit.     Sensory: No sensory deficit.     Motor: No weakness.     Coordination: Coordination normal.     Gait: Gait normal.     Deep Tendon Reflexes: Reflexes normal.  Psychiatric:        Mood and Affect: Mood normal.        Behavior: Behavior normal.        Thought Content: Thought content normal.        Judgment: Judgment normal.       Assessment & Plan:  1. Routine general medical examination at a health care facility - healthy and remarkable 84 year old female  - Continue to exercise and eat healthy  - Follow up in one year or sooner if needed - CBC with Differential/Platelet; Future - Lipid panel; Future - TSH; Future - CMP with eGFR([facility]); Future  2. Essential hypertension - Controlled - CBC with Differential/Platelet; Future - Lipid panel; Future - TSH; Future - CMP with eGFR([facility]); Future - lisinopril (ZESTRIL) 5 MG tablet; Take 1 tablet (5 mg total) by mouth daily.  Dispense: 90 tablet; Refill: 3  3. Hyperlipidemia, unspecified hyperlipidemia type Controlled.  - CBC with Differential/Platelet; Future - Lipid panel; Future - TSH; Future - CMP with eGFR([facility]); Future - atorvastatin (LIPITOR) 20 MG tablet; Take 1 tablet (20 mg total) by mouth daily.  Dispense: 90 tablet; Refill: 3  4. Hemorrhoids, unspecified hemorrhoid type - Will refer to GI  - Ambulatory referral to Gastroenterology -  hydrocortisone (ANUSOL-HC) 25 MG suppository; Place 1 suppository (25 mg total) rectally 2 (two) times daily.  Dispense: 12 suppository; Refill: 6  Dorothyann Peng, NP

## 2020-09-26 NOTE — Addendum Note (Signed)
Addended by: Marrion Coy on: 09/26/2020 10:04 AM   Modules accepted: Orders

## 2020-09-27 LAB — LIPID PANEL
Cholesterol: 157 mg/dL (ref <200)
HDL: 60 mg/dL (ref >=50)
LDL Cholesterol (Calc): 83 mg/dL (calc)
Non-HDL Cholesterol (Calc): 97 mg/dL (calc) (ref <130)
Total CHOL/HDL Ratio: 2.6 (calc) (ref <5.0)
Triglycerides: 56 mg/dL (ref <150)

## 2020-09-27 LAB — CBC WITH DIFFERENTIAL/PLATELET
Absolute Monocytes: 371 cells/uL (ref 200–950)
Basophils Absolute: 41 cells/uL (ref 0–200)
Basophils Relative: 0.7 %
Eosinophils Absolute: 17 cells/uL (ref 15–500)
Eosinophils Relative: 0.3 %
HCT: 43.7 % (ref 35.0–45.0)
Hemoglobin: 14.6 g/dL (ref 11.7–15.5)
Lymphs Abs: 1601 cells/uL (ref 850–3900)
MCH: 30.7 pg (ref 27.0–33.0)
MCHC: 33.4 g/dL (ref 32.0–36.0)
MCV: 92 fL (ref 80.0–100.0)
MPV: 10.1 fL (ref 7.5–12.5)
Monocytes Relative: 6.4 %
Neutro Abs: 3770 cells/uL (ref 1500–7800)
Neutrophils Relative %: 65 %
Platelets: 254 10*3/uL (ref 140–400)
RBC: 4.75 10*6/uL (ref 3.80–5.10)
RDW: 12.4 % (ref 11.0–15.0)
Total Lymphocyte: 27.6 %
WBC: 5.8 10*3/uL (ref 3.8–10.8)

## 2020-09-27 LAB — COMPLETE METABOLIC PANEL WITH GFR
AG Ratio: 1.7 (calc) (ref 1.0–2.5)
ALT: 16 U/L (ref 6–29)
AST: 17 U/L (ref 10–35)
Albumin: 4.3 g/dL (ref 3.6–5.1)
Alkaline phosphatase (APISO): 66 U/L (ref 37–153)
BUN: 15 mg/dL (ref 7–25)
CO2: 30 mmol/L (ref 20–32)
Calcium: 9.8 mg/dL (ref 8.6–10.4)
Chloride: 100 mmol/L (ref 98–110)
Creat: 0.77 mg/dL (ref 0.60–0.88)
GFR, Est African American: 83 mL/min/{1.73_m2} (ref >=60)
GFR, Est Non African American: 72 mL/min/{1.73_m2} (ref >=60)
Globulin: 2.6 g/dL (calc) (ref 1.9–3.7)
Glucose, Bld: 106 mg/dL — ABNORMAL HIGH (ref 65–99)
Potassium: 5.1 mmol/L (ref 3.5–5.3)
Sodium: 137 mmol/L (ref 135–146)
Total Bilirubin: 0.8 mg/dL (ref 0.2–1.2)
Total Protein: 6.9 g/dL (ref 6.1–8.1)

## 2020-09-27 LAB — TSH: TSH: 1.66 mIU/L (ref 0.40–4.50)

## 2020-10-13 ENCOUNTER — Encounter: Payer: Self-pay | Admitting: Gastroenterology

## 2020-10-13 ENCOUNTER — Ambulatory Visit: Payer: Medicare Other | Admitting: Gastroenterology

## 2020-10-13 VITALS — BP 140/64 | HR 75 | Ht 63.0 in | Wt 115.0 lb

## 2020-10-13 DIAGNOSIS — R634 Abnormal weight loss: Secondary | ICD-10-CM | POA: Diagnosis not present

## 2020-10-13 DIAGNOSIS — K921 Melena: Secondary | ICD-10-CM

## 2020-10-13 MED ORDER — SUPREP BOWEL PREP KIT 17.5-3.13-1.6 GM/177ML PO SOLN: ORAL | 0 refills | Status: DC

## 2020-10-13 NOTE — Patient Instructions (Signed)
You have been scheduled for a colonoscopy. Please follow written instructions given to you at your visit today.  Please pick up your prep supplies at the pharmacy within the next 1-3 days. If you use inhalers (even only as needed), please bring them with you on the day of your procedure.  Thank you for choosing me and [facility].  Malcolm T. Stark, Jr., MD., FACG  

## 2020-10-13 NOTE — Progress Notes (Signed)
History of Present Illness: This is an 84 year old female referred by Dorothyann Peng, NP for the evaluation of hematochezia.  She is accompanied by her daughter.  She relates small amounts of bright red blood on the tissue paper when wiping after bowel movements.  The symptoms occur after almost every bowel movement.  That been going on for about 1 year.  She is used Anusol suppositories without much change in symptoms.  She has had 22 pound weight loss compared to November 2020.  She has been exercising much less since the Covid pandemic began and she feels she has lost muscle mass. Denies abdominal pain, constipation, diarrhea, change in stool caliber, melena, nausea, vomiting, dysphagia, reflux symptoms, chest pain.  Colonoscopy Jan 2013 mild sigmoid colon diverticulosis    No Known Allergies Outpatient Medications Prior to Visit  Medication Sig Dispense Refill   atorvastatin (LIPITOR) 20 MG tablet Take 1 tablet (20 mg total) by mouth daily. 90 tablet 3   Calcium 600-200 MG-UNIT tablet Take 1 tablet by mouth daily.     hydrocortisone (ANUSOL-HC) 25 MG suppository Place 1 suppository (25 mg total) rectally 2 (two) times daily. 12 suppository 6   lisinopril (ZESTRIL) 5 MG tablet Take 1 tablet (5 mg total) by mouth daily. 90 tablet 3   multivitamin (THERAGRAN) per tablet Take 1 tablet by mouth daily.     psyllium (METAMUCIL) 58.6 % packet Take 1 packet by mouth as needed.     No facility-administered medications prior to visit.   Past Medical History:  Diagnosis Date   Adenomatous colon polyp    Allergy    Arthritis    Basal cell carcinoma 2017   Left upper lip & right Ala Nasi   Hyperlipidemia    Past Surgical History:  Procedure Laterality Date   APPENDECTOMY     Social History   Socioeconomic History   Marital status: Widowed    Spouse name: Not on file   Number of children: Not on file   Years of education: Not on file   Highest education level: Not on  file  Occupational History   Not on file  Tobacco Use   Smoking status: Never Smoker   Smokeless tobacco: Never Used  Substance and Sexual Activity   Alcohol use: No    Alcohol/week: 0.0 standard drinks   Drug use: No   Sexual activity: Not on file  Other Topics Concern   Not on file  Social History Narrative   Widowed   Lives with daughter, husband and three grandchildren    [place] to [facility] to the [employer] and work out.    Two cats as pets     Social Determinants of Radio broadcast assistant Strain:    Difficulty of Paying Living Expenses: Not on file  Food Insecurity:    Worried About Charity fundraiser in the Last Year: Not on file   [employer] of Food in the Last Year: Not on file  Transportation Needs:    Lack of Transportation (Medical): Not on file   Lack of Transportation (Non-Medical): Not on file  Physical Activity:    Days of Exercise per Week: Not on file   Minutes of Exercise per Session: Not on file  Stress:    Feeling of Stress : Not on file  Social Connections:    Frequency of Communication with Friends and Family: Not on file   Frequency of Social Gatherings with Friends and Family: Not on  file   Attends Religious Services: Not on file   Active Member of Clubs or Organizations: Not on file   Attends Archivist Meetings: Not on file   Marital Status: Not on file   Family History  Problem Relation Age of Onset   Cancer Mother        throat   Diabetes Father    Heart disease Father    Heart attack Father       Review of Systems: Pertinent positive and negative review of systems were noted in the above HPI section. All other review of systems were otherwise negative.   Physical Exam: General: Well developed, well nourished, elderly, no acute distress Head: Normocephalic and atraumatic Eyes:  sclerae anicteric, EOMI Ears: Normal auditory acuity Mouth: Not examined, mask on during Covid-19 pandemic Neck: Supple, no masses  or thyromegaly Lungs: Clear throughout to auscultation Heart: Regular rate and rhythm; no murmurs, rubs or bruits Abdomen: Soft, non tender and non distended. No masses, hepatosplenomegaly or hernias noted. Normal Bowel sounds Rectal: no lesions, no tenderness,  Musculoskeletal: Symmetrical with no gross deformities  Skin: No lesions on visible extremities Pulses:  Normal pulses noted Extremities: No clubbing, cyanosis, edema or deformities noted Neurological: Alert oriented x 4, grossly nonfocal Cervical Nodes:  No significant cervical adenopathy Inguinal Nodes: No significant inguinal adenopathy Psychological:  Alert and cooperative. Normal mood and affect   Assessment and Recommendations:  1. Small volume hematochezia for 1 year. Unexplained weight loss. Possible benign anorectal source. R/O colorectal neoplasms. Continue suppositories. Schedule colonoscopy. The risks (including bleeding, perforation, infection, missed lesions, medication reactions and possible hospitalization or surgery if complications occur), benefits, and alternatives to colonoscopy with possible biopsy and possible polypectomy were discussed with the patient and they consent to proceed.     cc: Dorothyann Peng, NP [place],  [place] [phone]

## 2020-10-16 ENCOUNTER — Encounter: Payer: Self-pay | Admitting: Gastroenterology

## 2020-10-26 ENCOUNTER — Other Ambulatory Visit: Payer: Self-pay

## 2020-10-26 ENCOUNTER — Ambulatory Visit (AMBULATORY_SURGERY_CENTER): Payer: Medicare Other | Admitting: Gastroenterology

## 2020-10-26 ENCOUNTER — Encounter: Payer: Self-pay | Admitting: Gastroenterology

## 2020-10-26 VITALS — BP 92/53 | HR 53 | Temp 98.4°F | Resp 15 | Ht 63.0 in | Wt 115.0 lb

## 2020-10-26 DIAGNOSIS — K640 First degree hemorrhoids: Secondary | ICD-10-CM

## 2020-10-26 DIAGNOSIS — K921 Melena: Secondary | ICD-10-CM | POA: Diagnosis not present

## 2020-10-26 DIAGNOSIS — D123 Benign neoplasm of transverse colon: Secondary | ICD-10-CM

## 2020-10-26 DIAGNOSIS — D122 Benign neoplasm of ascending colon: Secondary | ICD-10-CM

## 2020-10-26 DIAGNOSIS — R634 Abnormal weight loss: Secondary | ICD-10-CM | POA: Diagnosis not present

## 2020-10-26 DIAGNOSIS — K5730 Diverticulosis of large intestine without perforation or abscess without bleeding: Secondary | ICD-10-CM

## 2020-10-26 MED ORDER — SODIUM CHLORIDE 0.9 % IV SOLN: 500.0000 mL | Freq: Once | INTRAVENOUS | Status: DC

## 2020-10-26 NOTE — Patient Instructions (Signed)
Handout on polyps, diverticulosis given.  Use preparation H suppository daily if needed for hemorrhoids.    YOU HAD AN ENDOSCOPIC PROCEDURE TODAY AT [facility]:   Refer to the procedure report that was given to you for any specific questions about what was found during the examination.  If the procedure report does not answer your questions, please call your gastroenterologist to clarify.  If you requested that your care partner not be given the details of your procedure findings, then the procedure report has been included in a sealed envelope for you to review at your convenience later.  YOU SHOULD EXPECT: Some feelings of bloating in the abdomen. Passage of more gas than usual.  Walking can help get rid of the air that was put into your GI tract during the procedure and reduce the bloating. If you had a lower endoscopy (such as a colonoscopy or flexible sigmoidoscopy) you may notice spotting of blood in your stool or on the toilet paper. If you underwent a bowel prep for your procedure, you may not have a normal bowel movement for a few days.  Please Note:  You might notice some irritation and congestion in your nose or some drainage.  This is from the oxygen used during your procedure.  There is no need for concern and it should clear up in a day or so.  SYMPTOMS TO REPORT IMMEDIATELY:   Following lower endoscopy (colonoscopy or flexible sigmoidoscopy):  Excessive amounts of blood in the stool  Significant tenderness or worsening of abdominal pains  Swelling of the abdomen that is new, acute  Fever of 100F or higher   For urgent or emergent issues, a gastroenterologist can be reached at any hour by calling [phone]. Do not use MyChart messaging for urgent concerns.    DIET:  We do recommend a small meal at first, but then you may proceed to your regular diet.  Drink plenty of fluids but you should avoid alcoholic beverages for 24 hours.  ACTIVITY:  You should  plan to take it easy for the rest of today and you should NOT DRIVE or use heavy machinery until tomorrow (because of the sedation medicines used during the test).    FOLLOW UP: Our staff will call the number listed on your records 48-72 hours following your procedure to check on you and address any questions or concerns that you may have regarding the information given to you following your procedure. If we do not reach you, we will leave a message.  We will attempt to reach you two times.  During this call, we will ask if you have developed any symptoms of COVID 19. If you develop any symptoms (ie: fever, flu-like symptoms, shortness of breath, cough etc.) before then, please call [phone].  If you test positive for Covid 19 in the 2 weeks post procedure, please call and report this information to [place].    If any biopsies were taken you will be contacted by phone or by letter within the next 1-3 weeks.  Please call us at [phone] if you have not heard about the biopsies in 3 weeks.    SIGNATURES/CONFIDENTIALITY: You and/or your care partner have signed paperwork which will be entered into your electronic medical record.  These signatures attest to the fact that that the information above on your After Visit Summary has been reviewed and is understood.  Full responsibility of the confidentiality of this discharge information lies with you and/or your care-partner.

## 2020-10-26 NOTE — Progress Notes (Signed)
Called to room to assist during endoscopic procedure.  Patient ID and intended procedure confirmed with present staff. Received instructions for my participation in the procedure from the performing physician.  

## 2020-10-26 NOTE — Progress Notes (Signed)
VS-CW 

## 2020-10-26 NOTE — Progress Notes (Signed)
To PACU, VSS. Report to [email or web address] 

## 2020-10-26 NOTE — Op Note (Signed)
[place] Patient Name: Gabriela Gomez Procedure Date: 10/26/2020 1:38 PM MRN: [id number] Endoscopist: Ladene Artist , MD Age: 84 Referring MD:  Date of Birth: 1936-07-08 Gender: Female Account #: [id number] Procedure:                Colonoscopy Indications:              Hematochezia, Weight loss Medicines:                Monitored Anesthesia Care Procedure:                Pre-Anesthesia Assessment:                           - Prior to the procedure, a History and Physical                            was performed, and patient medications and                            allergies were reviewed. The patient's tolerance of                            previous anesthesia was also reviewed. The risks                            and benefits of the procedure and the sedation                            options and risks were discussed with the patient.                            All questions were answered, and informed consent                            was obtained. Prior Anticoagulants: The patient has                            taken no previous anticoagulant or antiplatelet                            agents. ASA Grade Assessment: II - A patient with                            mild systemic disease. After reviewing the risks                            and benefits, the patient was deemed in                            satisfactory condition to undergo the procedure.                           After obtaining informed consent, the colonoscope  was passed under direct vision. Throughout the                            procedure, the patient's blood pressure, pulse, and                            oxygen saturations were monitored continuously. The                            Olympus PCF-[id number]) Colonoscope was                            introduced through the anus and advanced to the the                            cecum, identified by appendiceal  orifice and                            ileocecal valve. The ileocecal valve, appendiceal                            orifice, and rectum were photographed. The quality                            of the bowel preparation was adequate after                            extensive lavage and suction. The colonoscopy was                            somewhat difficult due to significant looping and a                            tortuous colon. The patient tolerated the procedure                            well. Scope In: 1:44:08 PM Scope Out: 2:09:41 PM Scope Withdrawal Time: 0 hours 17 minutes 35 seconds  Total Procedure Duration: 0 hours 25 minutes 33 seconds  Findings:                 The perianal and digital rectal examinations were                            normal.                           Two sessile polyps were found in the transverse                            colon and ascending colon. The polyps were 5 to 8                            mm in size. These polyps were removed with a cold  snare. Resection and retrieval were complete.                           Multiple medium-mouthed diverticula were found in                            the left colon. There was no evidence of                            diverticular bleeding.                           Internal hemorrhoids were found during                            retroflexion. The hemorrhoids were moderate and                            Grade I (internal hemorrhoids that do not prolapse).                           The exam was otherwise without abnormality on                            direct and retroflexion views. Complications:            No immediate complications. Estimated blood loss:                            None. Estimated Blood Loss:     Estimated blood loss: none. Impression:               - Two 5 to 8 mm polyps in the transverse colon and                            in the ascending colon, removed with  a cold snare.                            Resected and retrieved.                           - Mild diverticulosis in the left colon.                           - Internal hemorrhoids.                           - The examination was otherwise normal on direct                            and retroflexion views. Recommendation:           - Patient has a contact number available for                            emergencies. The signs and symptoms of potential  delayed complications were discussed with the                            patient. Return to normal activities tomorrow.                            Written discharge instructions were provided to the                            patient.                           - High fiber diet.                           - Continue present medications.                           - Await pathology results.                           - Prep H supp (OTC) 1 PR QD as needed for hemorhoid                            symptoms.                           - No repeat colonoscopy due to age. Ladene Artist, MD 10/26/2020 2:14:16 PM This report has been signed electronically.

## 2020-10-30 ENCOUNTER — Telehealth: Payer: Self-pay

## 2020-10-30 NOTE — Telephone Encounter (Signed)
Left message on follow up call. 

## 2020-11-01 ENCOUNTER — Encounter: Payer: Self-pay | Admitting: Gastroenterology

## 2020-11-21 ENCOUNTER — Other Ambulatory Visit: Payer: Self-pay | Admitting: Adult Health

## 2020-11-21 DIAGNOSIS — Z1231 Encounter for screening mammogram for malignant neoplasm of breast: Secondary | ICD-10-CM

## 2020-12-01 ENCOUNTER — Telehealth: Payer: Self-pay | Admitting: Adult Health

## 2020-12-01 NOTE — Telephone Encounter (Signed)
Left message for patient to call back and schedule Medicare Annual Wellness Visit (AWV) either virtually or in office.   Last AWV no information please schedule at anytime with [facility] Nurse Health Advisor 1 or 2   This should be a 45 minute visit. 

## 2020-12-06 ENCOUNTER — Ambulatory Visit: Payer: Medicare Other

## 2021-01-02 ENCOUNTER — Ambulatory Visit
Admission: RE | Admit: 2021-01-02 | Discharge: 2021-01-02 | Disposition: A | Discharge status: Home / Self Care | Payer: Medicare Other | Source: Ambulatory Visit | Attending: Adult Health | Admitting: Adult Health

## 2021-01-02 ENCOUNTER — Other Ambulatory Visit: Payer: Self-pay

## 2021-01-02 DIAGNOSIS — Z1231 Encounter for screening mammogram for malignant neoplasm of breast: Secondary | ICD-10-CM | POA: Diagnosis not present

## 2021-01-25 ENCOUNTER — Ambulatory Visit: Payer: Medicare Other

## 2021-03-22 ENCOUNTER — Telehealth: Payer: Self-pay | Admitting: Adult Health

## 2021-03-22 NOTE — Telephone Encounter (Signed)
Left message for patient to call back and schedule Medicare Annual Wellness Visit (AWV) either virtually or in office.   Last AWV no information please schedule at anytime with [facility] Nurse Health Advisor 1 or 2   This should be a 45 minute visit. 

## 2021-05-02 DIAGNOSIS — H524 Presbyopia: Secondary | ICD-10-CM | POA: Diagnosis not present

## 2021-05-15 ENCOUNTER — Telehealth: Payer: Self-pay | Admitting: Adult Health

## 2021-05-15 NOTE — Telephone Encounter (Signed)
Left message for patient to call back and schedule Medicare Annual Wellness Visit (AWV) either virtually or in office.   Last AWV  please schedule at anytime with [facility] Nurse Health Advisor 1 or 2   This should be a 45 minute visit. 

## 2021-06-11 ENCOUNTER — Telehealth: Payer: Self-pay | Admitting: Adult Health

## 2021-06-11 NOTE — Telephone Encounter (Signed)
Left message for patient to call back and schedule Medicare Annual Wellness Visit (AWV) either virtually or in office.   awvi 11/11/09 per [facility]   please schedule at anytime with [facility] Nurse Health Advisor 1 or 2   This should be a 45 minute visit.  

## 2021-07-09 DIAGNOSIS — H40013 Open angle with borderline findings, low risk, bilateral: Secondary | ICD-10-CM | POA: Diagnosis not present

## 2021-07-09 DIAGNOSIS — H2511 Age-related nuclear cataract, right eye: Secondary | ICD-10-CM | POA: Diagnosis not present

## 2021-07-09 DIAGNOSIS — H2512 Age-related nuclear cataract, left eye: Secondary | ICD-10-CM | POA: Diagnosis not present

## 2021-07-23 DIAGNOSIS — H2512 Age-related nuclear cataract, left eye: Secondary | ICD-10-CM | POA: Diagnosis not present

## 2021-07-23 DIAGNOSIS — H409 Unspecified glaucoma: Secondary | ICD-10-CM | POA: Diagnosis not present

## 2021-07-23 DIAGNOSIS — H40012 Open angle with borderline findings, low risk, left eye: Secondary | ICD-10-CM | POA: Diagnosis not present

## 2021-07-23 DIAGNOSIS — H40011 Open angle with borderline findings, low risk, right eye: Secondary | ICD-10-CM | POA: Diagnosis not present

## 2021-07-31 ENCOUNTER — Ambulatory Visit (INDEPENDENT_AMBULATORY_CARE_PROVIDER_SITE_OTHER): Payer: Medicare Other

## 2021-07-31 ENCOUNTER — Other Ambulatory Visit: Payer: Self-pay

## 2021-07-31 VITALS — BP 122/62 | HR 75 | Temp 98.0°F | Wt 123.5 lb

## 2021-07-31 DIAGNOSIS — Z0000 Encounter for general adult medical examination without abnormal findings: Secondary | ICD-10-CM | POA: Diagnosis not present

## 2021-07-31 NOTE — Progress Notes (Addendum)
Subjective:   Gabriela Gomez is a 85 y.o. female who presents for an Initial Medicare Annual Wellness Visit.  Review of Systems     Cardiac Risk Factors include: advanced age (>76men, >64 women);hypertension;dyslipidemia     Objective:    Today's Vitals   07/31/21 1420  BP: 122/62  Pulse: 75  Temp: 98 F (36.7 C)  SpO2: 94%  Weight: 123 lb 8 oz (56 kg)   Body mass index is 21.88 kg/m.  Advanced Directives 07/31/2021  Does Patient Have a Medical Advance Directive? No  Would patient like information on creating a medical advance directive? Yes (MAU/Ambulatory/Procedural Areas - Information given)    Current Medications (verified) Outpatient Encounter Medications as of 07/31/2021  Medication Sig   atorvastatin (LIPITOR) 20 MG tablet Take 1 tablet (20 mg total) by mouth daily.   Calcium 600-200 MG-UNIT tablet Take 1 tablet by mouth daily.   hydrocortisone (ANUSOL-HC) 25 MG suppository Place 1 suppository (25 mg total) rectally 2 (two) times daily.   lisinopril (ZESTRIL) 5 MG tablet Take 1 tablet (5 mg total) by mouth daily.   multivitamin (THERAGRAN) per tablet Take 1 tablet by mouth daily.   prednisoLONE acetate (PRED FORTE) 1 % ophthalmic suspension SMARTSIG:In Eye(s)   psyllium (METAMUCIL) 58.6 % packet Take 1 packet by mouth as needed.   No facility-administered encounter medications on file as of 07/31/2021.    Allergies (verified) Patient has no known allergies.   History: Past Medical History:  Diagnosis Date   Adenomatous colon polyp    Allergy    Arthritis    Basal cell carcinoma 2017   Left upper lip & right [place]   Hyperlipidemia    Past Surgical History:  Procedure Laterality Date   APPENDECTOMY     Family History  Problem Relation Age of Onset   Cancer Mother        throat   Diabetes Father    Heart disease Father    Heart attack Father    Colon cancer Neg Hx    Esophageal cancer Neg Hx    Rectal cancer Neg Hx    Stomach cancer Neg Hx     Social History   Socioeconomic History   Marital status: Widowed    Spouse name: Not on file   Number of children: Not on file   Years of education: Not on file   Highest education level: Not on file  Occupational History   Not on file  Tobacco Use   Smoking status: Never   Smokeless tobacco: Never  Vaping Use   Vaping Use: Never used  Substance and Sexual Activity   Alcohol use: No    Alcohol/week: 0.0 standard drinks   Drug use: No   Sexual activity: Not on file  Other Topics Concern   Not on file  Social History Narrative   Widowed   Lives with daughter, husband and three grandchildren    [facility] [employer] and work [facility].    Two cats as pets     Social Determinants of Radio broadcast assistant Strain: Low Risk    Difficulty of Paying Living Expenses: Not hard at all  Food Insecurity: No Food Insecurity   Worried About Charity fundraiser in the Last Year: Never true   Arboriculturist in the Last Year: Never true  Transportation Needs: No Transportation Needs   Lack of Transportation (Medical): No   Lack of Transportation (Non-Medical): No  Physical Activity: Sufficiently Active   Days of Exercise per Week: 3 days   Minutes of Exercise per Session: 60 min  Stress: No Stress Concern Present   Feeling of Stress : Not at all  Social Connections: Moderately Integrated   Frequency of Communication with Friends and Family: More than three times a week   Frequency of Social Gatherings with Friends and Family: More than three times a week   Attends Religious Services: More than 4 times per year   Active Member of [employer] or Organizations: Yes   Attends Archivist Meetings: 1 to 4 times per year   Marital Status: Widowed    Tobacco Counseling Counseling given: Not Answered   Clinical Intake:  Pre-visit preparation completed: Yes  Pain : No/denies pain     BMI - recorded: 21.88 Nutritional Status: BMI of 19-24  Normal Nutritional Risks:  None Diabetes: No  How often do you need to have someone help you when you read instructions, pamphlets, or other written materials from your doctor or pharmacy?: 1 - Never  Diabetic?No  Interpreter Needed?: No  Information entered by :: Charlott Rakes, LPN   Activities of Daily Living In your present state of health, do you have any difficulty performing the following activities: 07/31/2021  Hearing? Y  Comment slight  Vision? N  Difficulty concentrating or making decisions? N  Walking or climbing stairs? N  Dressing or bathing? N  Doing errands, shopping? N  Preparing Food and eating ? N  Using the Toilet? N  In the past six months, have you accidently leaked urine? N  Do you have problems with loss of bowel control? N  Managing your Medications? N  Managing your Finances? N  Housekeeping or managing your Housekeeping? N  Some recent data might be hidden    Patient Care Team: Dorothyann Peng, NP as PCP - General (Family Medicine)  Indicate any recent Medical Services you may have received from other than [facility] providers in the past year (date may be approximate).     Assessment:   This is a routine wellness examination for Gabriela Gomez.  Hearing/Vision screen Hearing Screening - Comments:: Pt has mild loss Vision Screening - Comments:: Pt follows with [facility] for annual eye exams   Dietary issues and exercise activities discussed: Current Exercise Habits: Structured exercise class;Home exercise routine, Type of exercise: Other - see comments, Time (Minutes): 60, Frequency (Times/Week): 3, Weekly Exercise (Minutes/Week): 180   Goals Addressed             This Visit's Progress    Patient Stated       None at this time       Depression Screen PHQ 2/9 Scores 07/31/2021 09/26/2020 09/03/2018 09/02/2017 03/13/2015 03/13/2015  PHQ - 2 Score 0 0 0 0 0 0    Fall Risk Fall Risk  07/31/2021 09/26/2020 09/03/2018 09/02/2017 03/13/2015  Falls in the past year? 0 0 No No No   Number falls in past yr: 0 0 - - -  Injury with Fall? 0 0 - - -  Risk for fall due to : Impaired vision - - - -  Follow up Falls prevention discussed - - - -    FALL RISK PREVENTION PERTAINING TO THE HOME:  Any stairs in or around the home? No  If so, are there any without handrails? No  Home free of loose throw rugs in walkways, pet beds, electrical cords, etc? Yes  Adequate lighting in your home to  reduce risk of falls? Yes   ASSISTIVE DEVICES UTILIZED TO PREVENT FALLS:  Life alert? No  Use of a cane, walker or w/c? No  Grab bars in the bathroom? Yes  Shower chair or bench in shower? No  Elevated toilet seat or a handicapped toilet? No   TIMED UP AND GO:  Was the test performed? Yes .  Length of time to ambulate 10 feet: 10 sec.   Gait steady and fast with assistive device  Cognitive Function:     6CIT Screen 07/31/2021  What Year? 0 points  What month? 0 points  What time? 0 points  Count back from 20 0 points  Months in reverse 2 points  Repeat phrase 4 points  Total Score 6    Immunizations Immunization History  Administered Date(s) Administered   Fluad Quad(high Dose 65+) 08/17/2019   Influenza Split 08/16/2011, 08/14/2012   Influenza Whole 08/12/2008, 08/17/2009, 07/23/2010   Influenza, High Dose Seasonal PF 08/18/2013, 08/11/2015, 08/29/2016, 08/20/2017, 08/27/2018   Influenza,inj,Quad PF,6+ Mos 08/25/2014   Influenza-Unspecified 08/11/2020   PFIZER Comirnaty(Gray Top)Covid-19 Tri-Sucrose Vaccine 03/02/2021   PFIZER(Purple Top)SARS-COV-2 Vaccination 11/21/2019, 12/12/2019, 08/11/2020   Pneumococcal Conjugate-13 03/13/2015   Pneumococcal Polysaccharide-23 11/12/2003   Tdap 08/02/2011   Zoster, Live 02/11/2012    TDAP status: Up to date  Flu Vaccine status: Due, Education has been provided regarding the importance of this vaccine. Advised may receive this vaccine at local pharmacy or Health Dept. Aware to provide a copy of the vaccination record if  obtained from local pharmacy or Health Dept. Verbalized acceptance and understanding.  Pneumococcal vaccine status: Up to date  Covid-19 vaccine status: Completed vaccines  Qualifies for Shingles Vaccine? Yes   Zostavax completed Yes   Shingrix Completed?: No.    Education has been provided regarding the importance of this vaccine. Patient has been advised to call insurance company to determine out of pocket expense if they have not yet received this vaccine. Advised may also receive vaccine at local pharmacy or Health Dept. Verbalized acceptance and understanding.  Screening Tests Health Maintenance  Topic Date Due   Zoster Vaccines- Shingrix (1 of 2) Never done   INFLUENZA VACCINE  06/11/2021   TETANUS/TDAP  08/01/2021   DEXA SCAN  Completed   COVID-19 Vaccine  Completed   HPV VACCINES  Aged Out    Health Maintenance  Health Maintenance Due  Topic Date Due   Zoster Vaccines- Shingrix (1 of 2) Never done   INFLUENZA VACCINE  06/11/2021    Colorectal cancer screening: No longer required.   Mammogram status: No longer required due to age.  Bone Density status: Completed 09/28/19. Results reflect: Bone density results: OSTEOPENIA. Repeat every 2 years.    Additional Screening:  Hepatitis C Screening: does not qualify;  Vision Screening: Recommended annual ophthalmology exams for early detection of glaucoma and other disorders of the eye. Is the patient up to date with their annual eye exam?  Yes  Who is the provider or what is the name of the office in which the patient attends annual eye exams? [facility]  If pt is not established with a provider, would they like to be referred to a provider to establish care? No .   Dental Screening: Recommended annual dental exams for proper oral hygiene  Community Resource Referral / Chronic Care Management: CRR required this visit?  No   CCM required this visit?  No      Plan:     I have personally  reviewed and noted the  following in the patient's chart:   Medical and social history Use of alcohol, tobacco or illicit drugs  Current medications and supplements including opioid prescriptions. Patient is not currently taking opioid prescriptions. Functional ability and status Nutritional status Physical activity Advanced directives List of other physicians Hospitalizations, surgeries, and ER visits in previous 12 months Vitals Screenings to include cognitive, depression, and falls Referrals and appointments  In addition, I have reviewed and discussed with patient certain preventive protocols, quality metrics, and best practice recommendations. A written personalized care plan for preventive services as well as general preventive health recommendations were provided to patient.     Willette Brace, LPN   [id number]   Nurse Notes: None

## 2021-07-31 NOTE — Patient Instructions (Signed)
Gabriela Gomez , Thank you for taking time to come for your Medicare Wellness Visit. I appreciate your ongoing commitment to your health goals. Please review the following plan we discussed and let me know if I can assist you in the future.   Screening recommendations/referrals: Colonoscopy: No longer required  Mammogram: No longer required  Bone Density: Done 11/1718 repeat every 2 years  Recommended yearly ophthalmology/optometry visit for glaucoma screening and checkup Recommended yearly dental visit for hygiene and checkup  Vaccinations: Influenza vaccine: Due and discussed Pneumococcal vaccine: Completed  Tdap vaccine: Done 08/01/21 repeat every 10 years due 08/01/21 Shingles vaccine: Shingrix discussed. Please contact your pharmacy for coverage information.    Covid-19:Completed 1/10, 1/31, 08/11/20 & 03/02/21  Advanced directives: Please bring a copy of your health care power of attorney and living will to the office at your convenience.  Conditions/risks identified: None at this time  Next appointment: Follow up in one year for your annual wellness visit    Preventive Care 65 Years and Older, Female Preventive care refers to lifestyle choices and visits with your health care provider that can promote health and wellness. What does preventive care include? A yearly physical exam. This is also called an annual well check. Dental exams once or twice a year. Routine eye exams. Ask your health care provider how often you should have your eyes checked. Personal lifestyle choices, including: Daily care of your teeth and gums. Regular physical activity. Eating a healthy diet. Avoiding tobacco and drug use. Limiting alcohol use. Practicing safe sex. Taking low-dose aspirin every day. Taking vitamin and mineral supplements as recommended by your health care provider. What happens during an annual well check? The services and screenings done by your health care provider during your annual  well check will depend on your age, overall health, lifestyle risk factors, and family history of disease. Counseling  Your health care provider may ask you questions about your: Alcohol use. Tobacco use. Drug use. Emotional well-being. Home and relationship well-being. Sexual activity. Eating habits. History of falls. Memory and ability to understand (cognition). Work and work Statistician. Reproductive health. Screening  You may have the following tests or measurements: Height, weight, and BMI. Blood pressure. Lipid and cholesterol levels. These may be checked every 5 years, or more frequently if you are over 85 years old. Skin check. Lung cancer screening. You may have this screening every year starting at age 69 if you have a 30-pack-year history of smoking and currently smoke or have quit within the past 15 years. Fecal occult blood test (FOBT) of the stool. You may have this test every year starting at age 54. Flexible sigmoidoscopy or colonoscopy. You may have a sigmoidoscopy every 5 years or a colonoscopy every 10 years starting at age 64. Hepatitis C blood test. Hepatitis B blood test. Sexually transmitted disease (STD) testing. Diabetes screening. This is done by checking your blood sugar (glucose) after you have not eaten for a while (fasting). You may have this done every 1-3 years. Bone density scan. This is done to screen for osteoporosis. You may have this done starting at age 50. Mammogram. This may be done every 1-2 years. Talk to your health care provider about how often you should have regular mammograms. Talk with your health care provider about your test results, treatment options, and if necessary, the need for more tests. Vaccines  Your health care provider may recommend certain vaccines, such as: Influenza vaccine. This is recommended every year. Tetanus, diphtheria, and  acellular pertussis (Tdap, Td) vaccine. You may need a Td booster every 10 years. Zoster  vaccine. You may need this after age 31. Pneumococcal 13-valent conjugate (PCV13) vaccine. One dose is recommended after age 74. Pneumococcal polysaccharide (PPSV23) vaccine. One dose is recommended after age 11. Talk to your health care provider about which screenings and vaccines you need and how often you need them. This information is not intended to replace advice given to you by your health care provider. Make sure you discuss any questions you have with your health care provider. Document Released: 11/24/2015 Document Revised: 07/17/2016 Document Reviewed: 08/29/2015 Elsevier Interactive Patient Education  2017 [employer] Prevention in the Home Falls can cause injuries. They can happen to people of all ages. There are many things you can do to make your home safe and to help prevent falls. What can I do on the outside of my home? Regularly fix the edges of walkways and driveways and fix any cracks. Remove anything that might make you trip as you walk through a door, such as a raised step or threshold. Trim any bushes or trees on the path to your home. Use bright outdoor lighting. Clear any walking paths of anything that might make someone trip, such as rocks or tools. Regularly check to see if handrails are loose or broken. Make sure that both sides of any steps have handrails. Any raised decks and porches should have guardrails on the edges. Have any leaves, snow, or ice cleared regularly. Use sand or salt on walking paths during winter. Clean up any spills in your garage right away. This includes oil or grease spills. What can I do in the bathroom? Use night lights. Install grab bars by the toilet and in the tub and shower. Do not use towel bars as grab bars. Use non-skid mats or decals in the tub or shower. If you need to sit down in the shower, use a plastic, non-slip stool. Keep the floor dry. Clean up any water that spills on the floor as soon as it happens. Remove  soap buildup in the tub or shower regularly. Attach bath mats securely with double-sided non-slip rug tape. Do not have throw rugs and other things on the floor that can make you trip. What can I do in the bedroom? Use night lights. Make sure that you have a light by your bed that is easy to reach. Do not use any sheets or blankets that are too big for your bed. They should not hang down onto the floor. Have a firm chair that has side arms. You can use this for support while you get dressed. Do not have throw rugs and other things on the floor that can make you trip. What can I do in the kitchen? Clean up any spills right away. Avoid walking on wet floors. Keep items that you use a lot in easy-to-reach places. If you need to reach something above you, use a strong step stool that has a grab bar. Keep electrical cords out of the way. Do not use floor polish or wax that makes floors slippery. If you must use wax, use non-skid floor wax. Do not have throw rugs and other things on the floor that can make you trip. What can I do with my stairs? Do not leave any items on the stairs. Make sure that there are handrails on both sides of the stairs and use them. Fix handrails that are broken or loose. Make sure that  handrails are as long as the stairways. Check any carpeting to make sure that it is firmly attached to the stairs. Fix any carpet that is loose or worn. Avoid having throw rugs at the top or bottom of the stairs. If you do have throw rugs, attach them to the floor with carpet tape. Make sure that you have a light switch at the top of the stairs and the bottom of the stairs. If you do not have them, ask someone to add them for you. What else can I do to help prevent falls? Wear shoes that: Do not have high heels. Have rubber bottoms. Are comfortable and fit you well. Are closed at the toe. Do not wear sandals. If you use a stepladder: Make sure that it is fully opened. Do not climb a  closed stepladder. Make sure that both sides of the stepladder are locked into place. Ask someone to hold it for you, if possible. Clearly mark and make sure that you can see: Any grab bars or handrails. First and last steps. Where the edge of each step is. Use tools that help you move around (mobility aids) if they are needed. These include: Canes. Walkers. Scooters. Crutches. Turn on the lights when you go into a dark area. Replace any light bulbs as soon as they burn out. Set up your furniture so you have a clear path. Avoid moving your furniture around. If any of your floors are uneven, fix them. If there are any pets around you, be aware of where they are. Review your medicines with your doctor. Some medicines can make you feel dizzy. This can increase your chance of falling. Ask your doctor what other things that you can do to help prevent falls. This information is not intended to replace advice given to you by your health care provider. Make sure you discuss any questions you have with your health care provider. Document Released: 08/24/2009 Document Revised: 04/04/2016 Document Reviewed: 12/02/2014 Elsevier Interactive Patient Education  2017 [employer].

## 2021-08-06 DIAGNOSIS — H2511 Age-related nuclear cataract, right eye: Secondary | ICD-10-CM | POA: Diagnosis not present

## 2021-08-06 DIAGNOSIS — H40011 Open angle with borderline findings, low risk, right eye: Secondary | ICD-10-CM | POA: Diagnosis not present

## 2021-08-06 DIAGNOSIS — H40013 Open angle with borderline findings, low risk, bilateral: Secondary | ICD-10-CM | POA: Diagnosis not present

## 2021-08-11 HISTORY — PX: CATARACT EXTRACTION, BILATERAL: SHX1313

## 2021-09-27 ENCOUNTER — Ambulatory Visit (INDEPENDENT_AMBULATORY_CARE_PROVIDER_SITE_OTHER): Payer: Medicare Other | Admitting: Adult Health

## 2021-09-27 ENCOUNTER — Telehealth: Payer: Self-pay | Admitting: Adult Health

## 2021-09-27 ENCOUNTER — Encounter: Payer: Self-pay | Admitting: Adult Health

## 2021-09-27 VITALS — BP 122/64 | HR 75 | Temp 98.2°F | Ht 63.0 in | Wt 122.2 lb

## 2021-09-27 DIAGNOSIS — Z0000 Encounter for general adult medical examination without abnormal findings: Secondary | ICD-10-CM | POA: Diagnosis not present

## 2021-09-27 DIAGNOSIS — I10 Essential (primary) hypertension: Secondary | ICD-10-CM | POA: Diagnosis not present

## 2021-09-27 DIAGNOSIS — E785 Hyperlipidemia, unspecified: Secondary | ICD-10-CM | POA: Diagnosis not present

## 2021-09-27 LAB — COMPREHENSIVE METABOLIC PANEL
ALT: 14 U/L (ref 0–35)
AST: 24 U/L (ref 0–37)
Albumin: 4.2 g/dL (ref 3.5–5.2)
Alkaline Phosphatase: 74 U/L (ref 39–117)
BUN: 20 mg/dL (ref 6–23)
CO2: 30 mEq/L (ref 19–32)
Calcium: 9.6 mg/dL (ref 8.4–10.5)
Chloride: 102 mEq/L (ref 96–112)
Creatinine, Ser: 0.68 mg/dL (ref 0.40–1.20)
GFR: 80.65 mL/min (ref >60.00)
Glucose, Bld: 84 mg/dL (ref 70–99)
Potassium: 4 mEq/L (ref 3.5–5.1)
Sodium: 140 mEq/L (ref 135–145)
Total Bilirubin: 0.7 mg/dL (ref 0.2–1.2)
Total Protein: 7.4 g/dL (ref 6.0–8.3)

## 2021-09-27 LAB — CBC WITH DIFFERENTIAL/PLATELET
Basophils Absolute: 0 10*3/uL (ref 0.0–0.1)
Basophils Relative: 0.9 % (ref 0.0–3.0)
Eosinophils Absolute: 0.1 10*3/uL (ref 0.0–0.7)
Eosinophils Relative: 1.6 % (ref 0.0–5.0)
HCT: 41.8 % (ref 36.0–46.0)
Hemoglobin: 14.1 g/dL (ref 12.0–15.0)
Lymphocytes Relative: 31.4 % (ref 12.0–46.0)
Lymphs Abs: 1.5 10*3/uL (ref 0.7–4.0)
MCHC: 33.7 g/dL (ref 30.0–36.0)
MCV: 92.6 fl (ref 78.0–100.0)
Monocytes Absolute: 0.3 10*3/uL (ref 0.1–1.0)
Monocytes Relative: 6.3 % (ref 3.0–12.0)
Neutro Abs: 2.9 10*3/uL (ref 1.4–7.7)
Neutrophils Relative %: 59.8 % (ref 43.0–77.0)
Platelets: 240 10*3/uL (ref 150.0–400.0)
RBC: 4.51 Mil/uL (ref 3.87–5.11)
RDW: 13.3 % (ref 11.5–15.5)
WBC: 4.8 10*3/uL (ref 4.0–10.5)

## 2021-09-27 LAB — LIPID PANEL
Cholesterol: 171 mg/dL (ref 0–200)
HDL: 62.2 mg/dL (ref >39.00)
LDL Cholesterol: 94 mg/dL (ref 0–99)
NonHDL: 108.39
Total CHOL/HDL Ratio: 3
Triglycerides: 71 mg/dL (ref 0.0–149.0)
VLDL: 14.2 mg/dL (ref 0.0–40.0)

## 2021-09-27 LAB — TSH: TSH: 2.28 u[IU]/mL (ref 0.35–5.50)

## 2021-09-27 MED ORDER — ATORVASTATIN CALCIUM 20 MG PO TABS: 20.0000 mg | ORAL_TABLET | Freq: Every day | ORAL | 3 refills | Status: DC

## 2021-09-27 MED ORDER — LISINOPRIL 5 MG PO TABS: 5.0000 mg | ORAL_TABLET | Freq: Every day | ORAL | 3 refills | Status: DC

## 2021-09-27 NOTE — Progress Notes (Signed)
Subjective:    Patient ID: Gabriela Gomez, female    DOB: 24-Dec-1935, 85 y.o.   MRN: [id number]  HPI Patient presents for yearly preventative medicine examination. She is a pleasant and remarkable 85 year old female who  has a past medical history of Adenomatous colon polyp, Allergy, Arthritis, Basal cell carcinoma (2017), and Hyperlipidemia.  Essential hypertension-prescribed low-dose lisinopril 5 mg daily.  She does monitor her blood pressures at home and consistently has readings in the 120s to 130s over 80s.  She does have elevation of her blood pressure when she comes to her medical appointments.  She denies dizziness, lightheadedness, chest pain, shortness of breath, or syncopal episodes  BP Readings from Last 3 Encounters:  09/27/21 122/64  07/31/21 122/62  10/26/20 (!) 92/53   Hyperlipidemia-is currently prescribed Lipitor 20 mg daily.  She denies myalgia or fatigue  Lab Results  Component Value Date   CHOL 157 09/26/2020   HDL 60 09/26/2020   LDLCALC 83 09/26/2020   LDLDIRECT 139.1 08/19/2007   TRIG 56 09/26/2020   CHOLHDL 2.6 09/26/2020   History of skin cancer-is seen by dermatology on a routine basis  All immunizations and health maintenance protocols were reviewed with the patient and needed orders were placed.  Appropriate screening laboratory values were ordered for the patient including screening of hyperlipidemia, renal function and hepatic function.  Medication reconciliation,  past medical history, social history, problem list and allergies were reviewed in detail with the patient  Goals were established with regard to weight loss, exercise, and  diet in compliance with medications. She does eat a heart healthy diet and and stays active  Wt Readings from Last 3 Encounters:  09/27/21 122 lb 3.2 oz (55.4 kg)  07/31/21 123 lb 8 oz (56 kg)  10/26/20 115 lb (52.2 kg)   She continues to get mammograms on a yearly basis and does monthly self breast exams at home.   She denies any changes in her breasts   Review of Systems  Constitutional: Negative.   HENT: Negative.    Eyes: Negative.   Respiratory: Negative.    Cardiovascular: Negative.   Gastrointestinal: Negative.   Endocrine: Negative.   Genitourinary: Negative.   Musculoskeletal: Negative.   Skin: Negative.   Allergic/Immunologic: Negative.   Neurological: Negative.   Hematological: Negative.   Psychiatric/Behavioral: Negative.     Past Medical History:  Diagnosis Date   Adenomatous colon polyp    Allergy    Arthritis    Basal cell carcinoma 2017   Left upper lip & right [place]   Hyperlipidemia     Social History   Socioeconomic History   Marital status: Widowed    Spouse name: Not on file   Number of children: Not on file   Years of education: Not on file   Highest education level: Not on file  Occupational History   Not on file  Tobacco Use   Smoking status: Never   Smokeless tobacco: Never  Vaping Use   Vaping Use: Never used  Substance and Sexual Activity   Alcohol use: No    Alcohol/week: 0.0 standard drinks   Drug use: No   Sexual activity: Not on file  Other Topics Concern   Not on file  Social History Narrative   Widowed   Lives with daughter, husband and three grandchildren    [facility] [employer] and [facility].    Two cats as pets     Social Determinants of  Health   Financial Resource Strain: Low Risk    Difficulty of Paying Living Expenses: Not hard at all  Food Insecurity: No Food Insecurity   Worried About [place] in the Last Year: Never true   Ran Out of Food in the Last Year: Never true  Transportation Needs: No Transportation Needs   Lack of Transportation (Medical): No   Lack of Transportation (Non-Medical): No  Physical Activity: Sufficiently Active   Days of Exercise per Week: 3 days   Minutes of Exercise per Session: 60 min  Stress: No Stress Concern Present   Feeling of Stress : Not at all  Social Connections:  Moderately Integrated   Frequency of Communication with Friends and Family: More than three times a week   Frequency of Social Gatherings with Friends and Family: More than three times a week   Attends Religious Services: More than 4 times per year   Active Member of [employer] or Organizations: Yes   Attends Archivist Meetings: 1 to 4 times per year   Marital Status: Widowed  Human resources officer Violence: Not At Risk   Fear of Current or Ex-Partner: No   Emotionally Abused: No   Physically Abused: No   Sexually Abused: No    Past Surgical History:  Procedure Laterality Date   APPENDECTOMY     CATARACT EXTRACTION, BILATERAL Bilateral 08/2021    Family History  Problem Relation Age of Onset   Cancer Mother        throat   Diabetes Father    Heart disease Father    Heart attack Father    Colon cancer Neg Hx    Esophageal cancer Neg Hx    Rectal cancer Neg Hx    Stomach cancer Neg Hx     No Known Allergies  Current Outpatient Medications on File Prior to Visit  Medication Sig Dispense Refill   atorvastatin (LIPITOR) 20 MG tablet Take 1 tablet (20 mg total) by mouth daily. 90 tablet 3   Calcium 600-200 MG-UNIT tablet Take 1 tablet by mouth daily.     hydrocortisone (ANUSOL-HC) 25 MG suppository Place 1 suppository (25 mg total) rectally 2 (two) times daily. 12 suppository 6   lisinopril (ZESTRIL) 5 MG tablet Take 1 tablet (5 mg total) by mouth daily. 90 tablet 3   multivitamin (THERAGRAN) per tablet Take 1 tablet by mouth daily.     psyllium (METAMUCIL) 58.6 % packet Take 1 packet by mouth as needed.     No current facility-administered medications on file prior to visit.    BP 122/64 (BP Location: Left Arm, Patient Position: Sitting, Cuff Size: Normal)   Pulse 75   Temp 98.2 F (36.8 C) (Oral)   Ht 5\' 3"  (1.6 m)   Wt 122 lb 3.2 oz (55.4 kg)   SpO2 98%   BMI 21.65 kg/m      Objective:   Physical Exam Vitals and nursing note reviewed.  Constitutional:       General: She is not in acute distress.    Appearance: Normal appearance. She is well-developed. She is not ill-appearing.  HENT:     Head: Normocephalic and atraumatic.     Right Ear: Tympanic membrane, ear canal and external ear normal. There is no impacted cerumen.     Left Ear: Tympanic membrane, ear canal and external ear normal. There is no impacted cerumen.     Nose: Nose normal. No congestion or rhinorrhea.     Mouth/Throat:  Mouth: Mucous membranes are moist.     Pharynx: Oropharynx is clear. No oropharyngeal exudate or posterior oropharyngeal erythema.  Eyes:     General:        Right eye: No discharge.        Left eye: No discharge.     Extraocular Movements: Extraocular movements intact.     Conjunctiva/sclera: Conjunctivae normal.     Pupils: Pupils are equal, round, and reactive to light.  Neck:     Thyroid: No thyromegaly.     Vascular: No carotid bruit.     Trachea: No tracheal deviation.  Cardiovascular:     Rate and Rhythm: Normal rate and regular rhythm.     Pulses: Normal pulses.     Heart sounds: Normal heart sounds. No murmur heard.   No friction rub. No gallop.  Pulmonary:     Effort: Pulmonary effort is normal. No respiratory distress.     Breath sounds: Normal breath sounds. No stridor. No wheezing, rhonchi or rales.  Chest:     Chest wall: No tenderness.  Abdominal:     General: Abdomen is flat. Bowel sounds are normal. There is no distension.     Palpations: Abdomen is soft. There is no mass.     Tenderness: There is no abdominal tenderness. There is no right CVA tenderness, left CVA tenderness, guarding or rebound.     Hernia: No hernia is present.  Musculoskeletal:        General: No swelling, tenderness, deformity or signs of injury. Normal range of motion.     Cervical back: Normal range of motion and neck supple.     Right lower leg: No edema.     Left lower leg: No edema.  Lymphadenopathy:     Cervical: No cervical adenopathy.  Skin:     General: Skin is warm and dry.     Coloration: Skin is not jaundiced or pale.     Findings: No bruising, erythema, lesion or rash.  Neurological:     General: No focal deficit present.     Mental Status: She is alert and oriented to person, place, and time.     Cranial Nerves: No cranial nerve deficit.     Sensory: No sensory deficit.     Motor: No weakness.     Coordination: Coordination normal.     Gait: Gait normal.     Deep Tendon Reflexes: Reflexes normal.  Psychiatric:        Mood and Affect: Mood normal.        Behavior: Behavior normal.        Thought Content: Thought content normal.        Judgment: Judgment normal.      Assessment & Plan:   1. Routine general medical examination at a health care facility - Benign exam  - Follow up in one year or sooner if needed - CBC with Differential/Platelet; Future - Comprehensive metabolic panel; Future - Lipid panel; Future - TSH; Future  2. Essential hypertension - well controlled.  - No change in medications  - CBC with Differential/Platelet; Future - Comprehensive metabolic panel; Future - Lipid panel; Future - TSH; Future - lisinopril (ZESTRIL) 5 MG tablet; Take 1 tablet (5 mg total) by mouth daily.  Dispense: 90 tablet; Refill: 3  3. Hyperlipidemia, unspecified hyperlipidemia type - Controlled. No change  - CBC with Differential/Platelet; Future - Comprehensive metabolic panel; Future - Lipid panel; Future - TSH; Future - atorvastatin (LIPITOR) 20 MG tablet; Take  1 tablet (20 mg total) by mouth daily.  Dispense: 90 tablet; Refill: 3   Dorothyann Peng, NP

## 2021-09-27 NOTE — Patient Instructions (Signed)
It was great seeing you today   We will follow up with you regarding your lab work   I will see you back in one year or sooner if needed

## 2021-09-27 NOTE — Telephone Encounter (Signed)
Noted! Rx sent to pharmacy. Pharmacy updated!

## 2021-09-27 NOTE — Telephone Encounter (Signed)
Patient came back in office today to let us know that she has switched pharmacies. She now uses [facility] at [address] [place], [place] [address]. She is needing her refills sent to [facility] of [facility].  Please advise.

## 2021-10-01 DIAGNOSIS — H4912 Fourth [trochlear] nerve palsy, left eye: Secondary | ICD-10-CM | POA: Diagnosis not present

## 2021-10-01 DIAGNOSIS — Z0100 Encounter for examination of eyes and vision without abnormal findings: Secondary | ICD-10-CM | POA: Diagnosis not present

## 2021-10-26 ENCOUNTER — Telehealth: Payer: Self-pay | Admitting: Adult Health

## 2021-10-26 DIAGNOSIS — E785 Hyperlipidemia, unspecified: Secondary | ICD-10-CM

## 2021-10-26 NOTE — Telephone Encounter (Signed)
Pt is aware rx was sent [facility]

## 2021-10-26 NOTE — Telephone Encounter (Signed)
Pt call and stated she need refill on atorvastatin (LIPITOR) 20 MG tablet  and atorvastatin (LIPITOR) 20 MG tablet  sent to  [facility], [address] AT [address] Phone:  [phone]  Fax:  [phone]

## 2021-10-26 NOTE — Telephone Encounter (Signed)
Tried to call pt no answer. This was already done in Nov. Please have the pt reach out to her pharmacy.

## 2021-11-15 DIAGNOSIS — H4912 Fourth [trochlear] nerve palsy, left eye: Secondary | ICD-10-CM | POA: Diagnosis not present

## 2022-01-15 ENCOUNTER — Other Ambulatory Visit: Payer: Self-pay | Admitting: Adult Health

## 2022-01-15 DIAGNOSIS — Z1231 Encounter for screening mammogram for malignant neoplasm of breast: Secondary | ICD-10-CM

## 2022-01-30 ENCOUNTER — Ambulatory Visit
Admission: RE | Admit: 2022-01-30 | Discharge: 2022-01-30 | Disposition: A | Discharge status: Home / Self Care | Payer: Medicare Other | Source: Ambulatory Visit | Attending: Adult Health | Admitting: Adult Health

## 2022-01-30 DIAGNOSIS — Z1231 Encounter for screening mammogram for malignant neoplasm of breast: Secondary | ICD-10-CM | POA: Diagnosis not present

## 2022-04-15 ENCOUNTER — Telehealth: Payer: Self-pay | Admitting: Adult Health

## 2022-04-15 NOTE — Telephone Encounter (Signed)
Tested positive for covid, had symptoms for 2 days prior. Requesting a prescription to help relieve symptoms

## 2022-07-24 ENCOUNTER — Other Ambulatory Visit: Payer: Self-pay | Admitting: Adult Health

## 2022-07-24 DIAGNOSIS — I10 Essential (primary) hypertension: Secondary | ICD-10-CM

## 2022-07-24 DIAGNOSIS — E785 Hyperlipidemia, unspecified: Secondary | ICD-10-CM

## 2022-08-13 ENCOUNTER — Ambulatory Visit (INDEPENDENT_AMBULATORY_CARE_PROVIDER_SITE_OTHER): Payer: Medicare Other

## 2022-08-13 VITALS — BP 112/64 | HR 66 | Temp 98.1°F | Ht 62.5 in | Wt 132.0 lb

## 2022-08-13 DIAGNOSIS — Z0000 Encounter for general adult medical examination without abnormal findings: Secondary | ICD-10-CM | POA: Diagnosis not present

## 2022-08-13 NOTE — Patient Instructions (Signed)
Gabriela Gomez , Thank you for taking time to come for your Medicare Wellness Visit. I appreciate your ongoing commitment to your health goals. Please review the following plan we discussed and let me know if I can assist you in the future.   Screening recommendations/referrals: Colonoscopy: not required Mammogram: completed 01/30/2022, due 02/01/2023 Bone Density: completed 09/28/2019 Recommended yearly ophthalmology/optometry visit for glaucoma screening and checkup Recommended yearly dental visit for hygiene and checkup  Vaccinations: Influenza vaccine: completed Pneumococcal vaccine: completed 03/13/2015 Tdap vaccine: due Shingles vaccine: discussed   Covid-19: 03/02/2021, 08/11/2020, 12/12/2019, 11/21/2019  Advanced directives: Advance directive discussed with you today. Even though you declined this today please call our office should you change your mind and we can give you the proper paperwork for you to fill out.  Conditions/risks identified: none  Next appointment: Follow up in one year for your annual wellness visit    Preventive Care 65 Years and Older, Female Preventive care refers to lifestyle choices and visits with your health care provider that can promote health and wellness. What does preventive care include? A yearly physical exam. This is also called an annual well check. Dental exams once or twice a year. Routine eye exams. Ask your health care provider how often you should have your eyes checked. Personal lifestyle choices, including: Daily care of your teeth and gums. Regular physical activity. Eating a healthy diet. Avoiding tobacco and drug use. Limiting alcohol use. Practicing safe sex. Taking low-dose aspirin every day. Taking vitamin and mineral supplements as recommended by your health care provider. What happens during an annual well check? The services and screenings done by your health care provider during your annual well check will depend on your age,  overall health, lifestyle risk factors, and family history of disease. Counseling  Your health care provider may ask you questions about your: Alcohol use. Tobacco use. Drug use. Emotional well-being. Home and relationship well-being. Sexual activity. Eating habits. History of falls. Memory and ability to understand (cognition). Work and work Statistician. Reproductive health. Screening  You may have the following tests or measurements: Height, weight, and BMI. Blood pressure. Lipid and cholesterol levels. These may be checked every 5 years, or more frequently if you are over 43 years old. Skin check. Lung cancer screening. You may have this screening every year starting at age 84 if you have a 30-pack-year history of smoking and currently smoke or have quit within the past 15 years. Fecal occult blood test (FOBT) of the stool. You may have this test every year starting at age 84. Flexible sigmoidoscopy or colonoscopy. You may have a sigmoidoscopy every 5 years or a colonoscopy every 10 years starting at age 84. Hepatitis C blood test. Hepatitis B blood test. Sexually transmitted disease (STD) testing. Diabetes screening. This is done by checking your blood sugar (glucose) after you have not eaten for a while (fasting). You may have this done every 1-3 years. Bone density scan. This is done to screen for osteoporosis. You may have this done starting at age 84. Mammogram. This may be done every 1-2 years. Talk to your health care provider about how often you should have regular mammograms. Talk with your health care provider about your test results, treatment options, and if necessary, the need for more tests. Vaccines  Your health care provider may recommend certain vaccines, such as: Influenza vaccine. This is recommended every year. Tetanus, diphtheria, and acellular pertussis (Tdap, Td) vaccine. You may need a Td booster every 10 years. Zoster  vaccine. You may need this after age  50. Pneumococcal 13-valent conjugate (PCV13) vaccine. One dose is recommended after age 84. Pneumococcal polysaccharide (PPSV23) vaccine. One dose is recommended after age 84. Talk to your health care provider about which screenings and vaccines you need and how often you need them. This information is not intended to replace advice given to you by your health care provider. Make sure you discuss any questions you have with your health care provider. Document Released: 11/24/2015 Document Revised: 07/17/2016 Document Reviewed: 08/29/2015 Elsevier Interactive Patient Education  2017 [place] Prevention in the Home Falls can cause injuries. They can happen to people of all ages. There are many things you can do to make your home safe and to help prevent falls. What can I do on the outside of my home? Regularly fix the edges of walkways and driveways and fix any cracks. Remove anything that might make you trip as you walk through a door, such as a raised step or threshold. Trim any bushes or trees on the path to your home. Use bright outdoor lighting. Clear any walking paths of anything that might make someone trip, such as rocks or tools. Regularly check to see if handrails are loose or broken. Make sure that both sides of any steps have handrails. Any raised decks and porches should have guardrails on the edges. Have any leaves, snow, or ice cleared regularly. Use sand or salt on walking paths during winter. Clean up any spills in your garage right away. This includes oil or grease spills. What can I do in the bathroom? Use night lights. Install grab bars by the toilet and in the tub and shower. Do not use towel bars as grab bars. Use non-skid mats or decals in the tub or shower. If you need to sit down in the shower, use a plastic, non-slip stool. Keep the floor dry. Clean up any water that spills on the floor as soon as it happens. Remove soap buildup in the tub or shower  regularly. Attach bath mats securely with double-sided non-slip rug tape. Do not have throw rugs and other things on the floor that can make you trip. What can I do in the bedroom? Use night lights. Make sure that you have a light by your bed that is easy to reach. Do not use any sheets or blankets that are too big for your bed. They should not hang down onto the floor. Have a firm chair that has side arms. You can use this for support while you get dressed. Do not have throw rugs and other things on the floor that can make you trip. What can I do in the kitchen? Clean up any spills right away. Avoid walking on wet floors. Keep items that you use a lot in easy-to-reach places. If you need to reach something above you, use a strong step stool that has a grab bar. Keep electrical cords out of the way. Do not use floor polish or wax that makes floors slippery. If you must use wax, use non-skid floor wax. Do not have throw rugs and other things on the floor that can make you trip. What can I do with my stairs? Do not leave any items on the stairs. Make sure that there are handrails on both sides of the stairs and use them. Fix handrails that are broken or loose. Make sure that handrails are as long as the stairways. Check any carpeting to make sure that it  is firmly attached to the stairs. Fix any carpet that is loose or worn. Avoid having throw rugs at the top or bottom of the stairs. If you do have throw rugs, attach them to the floor with carpet tape. Make sure that you have a light switch at the top of the stairs and the bottom of the stairs. If you do not have them, ask someone to add them for you. What else can I do to help prevent falls? Wear shoes that: Do not have high heels. Have rubber bottoms. Are comfortable and fit you well. Are closed at the toe. Do not wear sandals. If you use a stepladder: Make sure that it is fully opened. Do not climb a closed stepladder. Make sure that  both sides of the stepladder are locked into place. Ask someone to hold it for you, if possible. Clearly mark and make sure that you can see: Any grab bars or handrails. First and last steps. Where the edge of each step is. Use tools that help you move around (mobility aids) if they are needed. These include: Canes. Walkers. Scooters. Crutches. Turn on the lights when you go into a dark area. Replace any light bulbs as soon as they burn out. Set up your furniture so you have a clear path. Avoid moving your furniture around. If any of your floors are uneven, fix them. If there are any pets around you, be aware of where they are. Review your medicines with your doctor. Some medicines can make you feel dizzy. This can increase your chance of falling. Ask your doctor what other things that you can do to help prevent falls. This information is not intended to replace advice given to you by your health care provider. Make sure you discuss any questions you have with your health care provider. Document Released: 08/24/2009 Document Revised: 04/04/2016 Document Reviewed: 12/02/2014 Elsevier Interactive Patient Education  2017 [employer].

## 2022-08-13 NOTE — Progress Notes (Signed)
Subjective:   Koriana Stepien is a 86 y.o. female who presents for Medicare Annual (Subsequent) preventive examination.  Review of Systems     Cardiac Risk Factors include: advanced age (>65mn, >>65women);dyslipidemia;hypertension     Objective:    Today's Vitals   08/13/22 1425  BP: 112/64  Pulse: 66  Temp: 98.1 F (36.7 C)  TempSrc: Oral  SpO2: 97%  Weight: 132 lb (59.9 kg)  Height: 5' 2.5" (1.588 m)   Body mass index is 23.76 kg/m.     08/13/2022    2:34 PM 07/31/2021    2:26 PM  Advanced Directives  Does Patient Have a Medical Advance Directive? No No  Would patient like information on creating a medical advance directive?  Yes (MAU/Ambulatory/Procedural Areas - Information given)    Current Medications (verified) Outpatient Encounter Medications as of 08/13/2022  Medication Sig   atorvastatin (LIPITOR) 20 MG tablet TAKE 1 TABLET(20 MG) BY MOUTH DAILY   Calcium 600-200 MG-UNIT tablet Take 1 tablet by mouth daily.   hydrocortisone (ANUSOL-HC) 25 MG suppository Place 1 suppository (25 mg total) rectally 2 (two) times daily.   lisinopril (ZESTRIL) 5 MG tablet TAKE 1 TABLET(5 MG) BY MOUTH DAILY   multivitamin (THERAGRAN) per tablet Take 1 tablet by mouth daily.   psyllium (METAMUCIL) 58.6 % packet Take 1 packet by mouth as needed.   No facility-administered encounter medications on file as of 08/13/2022.    Allergies (verified) Patient has no known allergies.   History: Past Medical History:  Diagnosis Date   Adenomatous colon polyp    Allergy    Arthritis    Basal cell carcinoma 2017   Left upper lip & right Ala Nasi   Hyperlipidemia    Past Surgical History:  Procedure Laterality Date   APPENDECTOMY     CATARACT EXTRACTION, BILATERAL Bilateral 08/2021   Family History  Problem Relation Age of Onset   Cancer Mother        throat   Diabetes Father    Heart disease Father    Heart attack Father    Colon cancer Neg Hx    Esophageal cancer Neg Hx     Rectal cancer Neg Hx    Stomach cancer Neg Hx    Social History   Socioeconomic History   Marital status: Widowed    Spouse name: Not on file   Number of children: Not on file   Years of education: Not on file   Highest education level: Not on file  Occupational History   Not on file  Tobacco Use   Smoking status: Never   Smokeless tobacco: Never  Vaping Use   Vaping Use: Never used  Substance and Sexual Activity   Alcohol use: No    Alcohol/week: 0.0 standard drinks of alcohol   Drug use: No   Sexual activity: Not on file  Other Topics Concern   Not on file  Social History Narrative   Widowed   Lives with daughter, husband and three grandchildren    [facility] and work out.    Two cats as pets     Social Determinants of Health   Financial Resource Strain: Low Risk  (08/13/2022)   Overall Financial Resource Strain (CARDIA)    Difficulty of Paying Living Expenses: Not hard at all  Food Insecurity: No Food Insecurity (08/13/2022)   Hunger Vital Sign    Worried About Running Out of Food in the Last Year: Never true  Ran Out of Food in the Last Year: Never true  Transportation Needs: No Transportation Needs (08/13/2022)   PRAPARE - Hydrologist (Medical): No    Lack of Transportation (Non-Medical): No  Physical Activity: Sufficiently Active (08/13/2022)   Exercise Vital Sign    Days of Exercise per Week: 3 days    Minutes of Exercise per Session: 60 min  Stress: No Stress Concern Present (08/13/2022)   [place]    Feeling of Stress : Not at all  Social Connections: Moderately Integrated (07/31/2021)   Social Connection and Isolation Panel [NHANES]    Frequency of Communication with Friends and Family: More than three times a week    Frequency of Social Gatherings with Friends and Family: More than three times a week    Attends Religious Services: More than 4 times  per year    Active Member of [employer] or Organizations: Yes    Attends Archivist Meetings: 1 to 4 times per year    Marital Status: Widowed    Tobacco Counseling Counseling given: Not Answered   Clinical Intake:  Pre-visit preparation completed: Yes  Pain : No/denies pain     Nutritional Status: BMI of 19-24  Normal Nutritional Risks: None Diabetes: No  How often do you need to have someone help you when you read instructions, pamphlets, or other written materials from your doctor or pharmacy?: 1 - Never What is the last grade level you completed in school?: 12th grade  Diabetic? no  Interpreter Needed?: No  Information entered by :: NAllen LPN   Activities of Daily Living    08/13/2022    2:35 PM  In your present state of health, do you have any difficulty performing the following activities:  Hearing? 0  Vision? 0  Difficulty concentrating or making decisions? 0  Walking or climbing stairs? 0  Dressing or bathing? 0  Doing errands, shopping? 0  Preparing Food and eating ? N  Using the Toilet? N  In the past six months, have you accidently leaked urine? N  Do you have problems with loss of bowel control? N  Managing your Medications? N  Managing your Finances? N  Housekeeping or managing your Housekeeping? N    Patient Care Team: Dorothyann Peng, NP as PCP - General (Family Medicine)  Indicate any recent Medical Services you may have received from other than [facility] providers in the past year (date may be approximate).     Assessment:   This is a routine wellness examination for Adelisa.  Hearing/Vision screen Vision Screening - Comments:: Regular eye exams, [place]  Dietary issues and exercise activities discussed: Current Exercise Habits: Home exercise routine, Type of exercise: strength training/weights;yoga;Other - see comments (elliptical), Time (Minutes): 60, Frequency (Times/Week): 3, Weekly Exercise (Minutes/Week): 180   Goals Addressed              This Visit's Progress    Patient Stated       08/13/2022, no goals       Depression Screen    08/13/2022    2:34 PM 07/31/2021    2:25 PM 09/26/2020    8:58 AM 09/03/2018    9:51 AM 09/02/2017   10:30 AM 03/13/2015   10:34 AM 03/13/2015   10:00 AM  PHQ 2/9 Scores  PHQ - 2 Score 0 0 0 0 0 0 0    Fall Risk    08/13/2022  2:34 PM 07/31/2021    2:28 PM 09/26/2020    8:58 AM 09/03/2018    9:51 AM 09/02/2017   10:30 AM  Fall Risk   Falls in the past year? 0 0 0 No No  Number falls in past yr: 0 0 0    Injury with Fall? 0 0 0    Risk for fall due to : Medication side effect Impaired vision     Follow up Falls prevention discussed;Education provided;Falls evaluation completed Falls prevention discussed       FALL RISK PREVENTION PERTAINING TO THE HOME:  Any stairs in or around the home? No  If so, are there any without handrails? N/a Home free of loose throw rugs in walkways, pet beds, electrical cords, etc? Yes  Adequate lighting in your home to reduce risk of falls? Yes   ASSISTIVE DEVICES UTILIZED TO PREVENT FALLS:  Life alert? No  Use of a cane, walker or w/c? No  Grab bars in the bathroom? Yes  Shower chair or bench in shower? No  Elevated toilet seat or a handicapped toilet? No   TIMED UP AND GO:  Was the test performed? Yes .  Length of time to ambulate 10 feet: 5 sec.   Gait steady and fast without use of assistive device  Cognitive Function:        08/13/2022    2:37 PM 07/31/2021    2:30 PM  6CIT Screen  What Year? 0 points 0 points  What month? 0 points 0 points  What time? 0 points 0 points  Count back from 20 0 points 0 points  Months in reverse 2 points 2 points  Repeat phrase 6 points 4 points  Total Score 8 points 6 points    Immunizations Immunization History  Administered Date(s) Administered   Fluad Quad(high Dose 65+) 08/17/2019   Influenza Split 08/16/2011, 08/14/2012   Influenza Whole 08/12/2008, 08/17/2009,  07/23/2010   Influenza, High Dose Seasonal PF 08/18/2013, 08/11/2015, 08/29/2016, 08/20/2017, 08/27/2018   Influenza,inj,Quad PF,6+ Mos 08/25/2014   Influenza-Unspecified 08/11/2020, 09/04/2021   PFIZER Comirnaty(Gray Top)Covid-19 Tri-Sucrose Vaccine 03/02/2021   PFIZER(Purple Top)SARS-COV-2 Vaccination 11/21/2019, 12/12/2019, 08/11/2020   Pneumococcal Conjugate-13 03/13/2015   Pneumococcal Polysaccharide-23 11/12/2003   Tdap 08/02/2011   Zoster, Live 02/11/2012    TDAP status: Due, Education has been provided regarding the importance of this vaccine. Advised may receive this vaccine at local pharmacy or Health Dept. Aware to provide a copy of the vaccination record if obtained from local pharmacy or Health Dept. Verbalized acceptance and understanding.  Flu Vaccine status: Up to date  Pneumococcal vaccine status: Up to date  Covid-19 vaccine status: Completed vaccines  Qualifies for Shingles Vaccine? Yes   Zostavax completed Yes   Shingrix Completed?: Yes  Screening Tests Health Maintenance  Topic Date Due   Zoster Vaccines- Shingrix (1 of 2) Never done   COVID-19 Vaccine (5 - Pfizer series) 04/27/2021   TETANUS/TDAP  08/01/2021   INFLUENZA VACCINE  06/11/2022   Pneumonia Vaccine 20+ Years old  Completed   DEXA SCAN  Completed   HPV VACCINES  Aged Out    Health Maintenance  Health Maintenance Due  Topic Date Due   Zoster Vaccines- Shingrix (1 of 2) Never done   COVID-19 Vaccine (5 - Pfizer series) 04/27/2021   TETANUS/TDAP  08/01/2021   INFLUENZA VACCINE  06/11/2022    Colorectal cancer screening: No longer required.   Mammogram status: Completed 01/30/2022. Repeat every year  Bone Density status: Completed  09/28/2019  Lung Cancer Screening: (Low Dose CT Chest recommended if Age 61-80 years, 30 pack-year currently smoking OR have quit w/in 15years.) does not qualify.   Lung Cancer Screening Referral: no  Additional Screening:  Hepatitis C Screening: does not  qualify;   Vision Screening: Recommended annual ophthalmology exams for early detection of glaucoma and other disorders of the eye. Is the patient up to date with their annual eye exam?  Yes  Who is the provider or what is the name of the office in which the patient attends annual eye exams? [facility] If pt is not established with a provider, would they like to be referred to a provider to establish care? No .   Dental Screening: Recommended annual dental exams for proper oral hygiene  Community Resource Referral / Chronic Care Management: CRR required this visit?  No   CCM required this visit?  No      Plan:     I have personally reviewed and noted the following in the patient's chart:   Medical and social history Use of alcohol, tobacco or illicit drugs  Current medications and supplements including opioid prescriptions. Patient is not currently taking opioid prescriptions. Functional ability and status Nutritional status Physical activity Advanced directives List of other physicians Hospitalizations, surgeries, and ER visits in previous 12 months Vitals Screenings to include cognitive, depression, and falls Referrals and appointments  In addition, I have reviewed and discussed with patient certain preventive protocols, quality metrics, and best practice recommendations. A written personalized care plan for preventive services as well as general preventive health recommendations were provided to patient.     Kellie Simmering, LPN   [id number]   Nurse Notes: none

## 2022-09-24 DIAGNOSIS — D1801 Hemangioma of skin and subcutaneous tissue: Secondary | ICD-10-CM | POA: Diagnosis not present

## 2022-09-24 DIAGNOSIS — L814 Other melanin hyperpigmentation: Secondary | ICD-10-CM | POA: Diagnosis not present

## 2022-09-24 DIAGNOSIS — Z08 Encounter for follow-up examination after completed treatment for malignant neoplasm: Secondary | ICD-10-CM | POA: Diagnosis not present

## 2022-09-24 DIAGNOSIS — L821 Other seborrheic keratosis: Secondary | ICD-10-CM | POA: Diagnosis not present

## 2022-10-01 ENCOUNTER — Encounter: Payer: Self-pay | Admitting: Adult Health

## 2022-10-01 ENCOUNTER — Ambulatory Visit (INDEPENDENT_AMBULATORY_CARE_PROVIDER_SITE_OTHER): Payer: Medicare Other | Admitting: Adult Health

## 2022-10-01 VITALS — BP 130/70 | HR 60 | Temp 97.3°F | Ht 60.0 in | Wt 132.0 lb

## 2022-10-01 DIAGNOSIS — I10 Essential (primary) hypertension: Secondary | ICD-10-CM | POA: Diagnosis not present

## 2022-10-01 DIAGNOSIS — K649 Unspecified hemorrhoids: Secondary | ICD-10-CM

## 2022-10-01 DIAGNOSIS — E785 Hyperlipidemia, unspecified: Secondary | ICD-10-CM

## 2022-10-01 DIAGNOSIS — Z0000 Encounter for general adult medical examination without abnormal findings: Secondary | ICD-10-CM

## 2022-10-01 LAB — COMPREHENSIVE METABOLIC PANEL
ALT: 16 U/L (ref 0–35)
AST: 25 U/L (ref 0–37)
Albumin: 4.2 g/dL (ref 3.5–5.2)
Alkaline Phosphatase: 83 U/L (ref 39–117)
BUN: 21 mg/dL (ref 6–23)
CO2: 30 mEq/L (ref 19–32)
Calcium: 9.8 mg/dL (ref 8.4–10.5)
Chloride: 106 mEq/L (ref 96–112)
Creatinine, Ser: 0.64 mg/dL (ref 0.40–1.20)
GFR: 81.25 mL/min (ref >60.00)
Glucose, Bld: 89 mg/dL (ref 70–99)
Potassium: 5.2 mEq/L — ABNORMAL HIGH (ref 3.5–5.1)
Sodium: 142 mEq/L (ref 135–145)
Total Bilirubin: 0.5 mg/dL (ref 0.2–1.2)
Total Protein: 7.5 g/dL (ref 6.0–8.3)

## 2022-10-01 LAB — CBC WITH DIFFERENTIAL/PLATELET
Basophils Absolute: 0 10*3/uL (ref 0.0–0.1)
Basophils Relative: 0.8 % (ref 0.0–3.0)
Eosinophils Absolute: 0.1 10*3/uL (ref 0.0–0.7)
Eosinophils Relative: 1.5 % (ref 0.0–5.0)
HCT: 41.3 % (ref 36.0–46.0)
Hemoglobin: 13.9 g/dL (ref 12.0–15.0)
Lymphocytes Relative: 40.1 % (ref 12.0–46.0)
Lymphs Abs: 2.2 10*3/uL (ref 0.7–4.0)
MCHC: 33.8 g/dL (ref 30.0–36.0)
MCV: 92.6 fl (ref 78.0–100.0)
Monocytes Absolute: 0.4 10*3/uL (ref 0.1–1.0)
Monocytes Relative: 7.5 % (ref 3.0–12.0)
Neutro Abs: 2.7 10*3/uL (ref 1.4–7.7)
Neutrophils Relative %: 50.1 % (ref 43.0–77.0)
Platelets: 227 10*3/uL (ref 150.0–400.0)
RBC: 4.46 Mil/uL (ref 3.87–5.11)
RDW: 13.6 % (ref 11.5–15.5)
WBC: 5.5 10*3/uL (ref 4.0–10.5)

## 2022-10-01 LAB — LIPID PANEL
Cholesterol: 164 mg/dL (ref 0–200)
HDL: 61 mg/dL (ref >39.00)
LDL Cholesterol: 89 mg/dL (ref 0–99)
NonHDL: 102.62
Total CHOL/HDL Ratio: 3
Triglycerides: 67 mg/dL (ref 0.0–149.0)
VLDL: 13.4 mg/dL (ref 0.0–40.0)

## 2022-10-01 LAB — TSH: TSH: 1.77 u[IU]/mL (ref 0.35–5.50)

## 2022-10-01 MED ORDER — HYDROCORTISONE ACETATE 25 MG RE SUPP: 25.0000 mg | Freq: Two times a day (BID) | RECTAL | 6 refills | Status: DC

## 2022-10-01 NOTE — Progress Notes (Signed)
Subjective:    Patient ID: Gabriela Gomez, female    DOB: 09-14-1936, 86 y.o.   MRN: [id number]  HPI Patient presents for yearly preventative medicine examination. She is a pleasant 86 year old female who  has a past medical history of Adenomatous colon polyp, Allergy, Arthritis, Basal cell carcinoma (2017), and Hyperlipidemia.  Essential  hypertension-managed with low-dose lisinopril at 5 mg daily.  She does monitor her blood pressures at home and consistently has readings in the 120s to 130s over 80s.  She does have some elevation of her blood pressure when she comes to her medical appointments.  She denies dizziness, lightheadedness, chest pain, shortness of breath, or syncopal episodes BP Readings from Last 3 Encounters:  10/01/22 130/70  08/13/22 112/64  09/27/21 122/64   Hyperlipidemia-managed with Lipitor 20 mg daily.  She denies myalgia or fatigue Lab Results  Component Value Date   CHOL 171 09/27/2021   HDL 62.20 09/27/2021   LDLCALC 94 09/27/2021   LDLDIRECT 139.1 08/19/2007   TRIG 71.0 09/27/2021   CHOLHDL 3 09/27/2021   History of skin cancer-is seen by dermatology on a routine basis. She was recently seen and had no skin cancer detected.   Hemorrhoids - she will use Anusol suppositories as needed.Last prescribed in 2021.   All immunizations and health maintenance protocols were reviewed with the patient and needed orders were placed.  Appropriate screening laboratory values were ordered for the patient including screening of hyperlipidemia, renal function and hepatic function. If indicated by BPH, a PSA was ordered.  Medication reconciliation,  past medical history, social history, problem list and allergies were reviewed in detail with the patient  Goals were established with regard to weight loss, exercise, and  diet in compliance with medications. She is working out three days a week at [employer]. She is eating healthy.  Wt Readings from Last 3 Encounters:  10/01/22  132 lb (59.9 kg)  08/13/22 132 lb (59.9 kg)  09/27/21 122 lb 3.2 oz (55.4 kg)    Review of Systems  Constitutional: Negative.   HENT: Negative.    Eyes: Negative.   Respiratory: Negative.    Cardiovascular: Negative.   Gastrointestinal: Negative.   Endocrine: Negative.   Genitourinary: Negative.   Musculoskeletal: Negative.   Skin: Negative.   Allergic/Immunologic: Negative.   Neurological: Negative.   Hematological: Negative.   Psychiatric/Behavioral: Negative.     Past Medical History:  Diagnosis Date   Adenomatous colon polyp    Allergy    Arthritis    Basal cell carcinoma 2017   Left upper lip & right Ala Nasi   Hyperlipidemia     Social History   Socioeconomic History   Marital status: Widowed    Spouse name: Not on file   Number of children: Not on file   Years of education: Not on file   Highest education level: Not on file  Occupational History   Not on file  Tobacco Use   Smoking status: Never   Smokeless tobacco: Never  Vaping Use   Vaping Use: Never used  Substance and Sexual Activity   Alcohol use: No    Alcohol/week: 0.0 standard drinks of alcohol   Drug use: No   Sexual activity: Not on file  Other Topics Concern   Not on file  Social History Narrative   Widowed   Lives with daughter, husband and three grandchildren    [facility] and work out.    Two cats  as pets     Social Determinants of Health   Financial Resource Strain: Low Risk  (08/13/2022)   Overall Financial Resource Strain (CARDIA)    Difficulty of Paying Living Expenses: Not hard at all  Food Insecurity: No Food Insecurity (08/13/2022)   Hunger Vital Sign    Worried About Running Out of Food in the Last Year: Never true    Ran Out of Food in the Last Year: Never true  Transportation Needs: No Transportation Needs (08/13/2022)   PRAPARE - Hydrologist (Medical): No    Lack of Transportation (Non-Medical): No  Physical Activity:  Sufficiently Active (08/13/2022)   Exercise Vital Sign    Days of Exercise per Week: 3 days    Minutes of Exercise per Session: 60 min  Stress: No Stress Concern Present (08/13/2022)   [place]    Feeling of Stress : Not at all  Social Connections: Moderately Integrated (07/31/2021)   Social Connection and Isolation Panel [NHANES]    Frequency of Communication with Friends and Family: More than three times a week    Frequency of Social Gatherings with Friends and Family: More than three times a week    Attends Religious Services: More than 4 times per year    Active Member of [employer] or Organizations: Yes    Attends Archivist Meetings: 1 to 4 times per year    Marital Status: Widowed  Intimate Partner Violence: Not At Risk (07/31/2021)   Humiliation, Afraid, Rape, and Kick questionnaire    Fear of Current or Ex-Partner: No    Emotionally Abused: No    Physically Abused: No    Sexually Abused: No    Past Surgical History:  Procedure Laterality Date   APPENDECTOMY     CATARACT EXTRACTION, BILATERAL Bilateral 08/2021    Family History  Problem Relation Age of Onset   Cancer Mother        throat   Diabetes Father    Heart disease Father    Heart attack Father    Colon cancer Neg Hx    Esophageal cancer Neg Hx    Rectal cancer Neg Hx    Stomach cancer Neg Hx     No Known Allergies  Current Outpatient Medications on File Prior to Visit  Medication Sig Dispense Refill   atorvastatin (LIPITOR) 20 MG tablet TAKE 1 TABLET(20 MG) BY MOUTH DAILY 90 tablet 1   Calcium 600-200 MG-UNIT tablet Take 1 tablet by mouth daily.     hydrocortisone (ANUSOL-HC) 25 MG suppository Place 1 suppository (25 mg total) rectally 2 (two) times daily. 12 suppository 6   lisinopril (ZESTRIL) 5 MG tablet TAKE 1 TABLET(5 MG) BY MOUTH DAILY 90 tablet 1   multivitamin (THERAGRAN) per tablet Take 1 tablet by mouth daily.     psyllium  (METAMUCIL) 58.6 % packet Take 1 packet by mouth as needed.     No current facility-administered medications on file prior to visit.    BP 130/70   Pulse 60   Temp (!) 97.3 F (36.3 C) (Oral)   Ht 5' (1.524 m)   Wt 132 lb (59.9 kg)   SpO2 96%   BMI 25.78 kg/m       Objective:   Physical Exam Vitals and nursing note reviewed.  Constitutional:      General: She is not in acute distress.    Appearance: Normal appearance. She is well-developed. She is  not ill-appearing.  HENT:     Head: Normocephalic and atraumatic.     Right Ear: Tympanic membrane, ear canal and external ear normal. There is no impacted cerumen.     Left Ear: Tympanic membrane, ear canal and external ear normal. There is no impacted cerumen.     Nose: Nose normal. No congestion or rhinorrhea.     Mouth/Throat:     Mouth: Mucous membranes are moist.     Pharynx: Oropharynx is clear. No oropharyngeal exudate or posterior oropharyngeal erythema.  Eyes:     General:        Right eye: No discharge.        Left eye: No discharge.     Extraocular Movements: Extraocular movements intact.     Conjunctiva/sclera: Conjunctivae normal.     Pupils: Pupils are equal, round, and reactive to light.  Neck:     Thyroid: No thyromegaly.     Vascular: No carotid bruit.     Trachea: No tracheal deviation.  Cardiovascular:     Rate and Rhythm: Normal rate and regular rhythm.     Pulses: Normal pulses.     Heart sounds: Normal heart sounds. No murmur heard.    No friction rub. No gallop.  Pulmonary:     Effort: Pulmonary effort is normal. No respiratory distress.     Breath sounds: Normal breath sounds. No stridor. No wheezing, rhonchi or rales.  Chest:     Chest wall: No tenderness.  Abdominal:     General: Abdomen is flat. Bowel sounds are normal. There is no distension.     Palpations: Abdomen is soft. There is no mass.     Tenderness: There is no abdominal tenderness. There is no right CVA tenderness, left CVA  tenderness, guarding or rebound.     Hernia: No hernia is present.  Musculoskeletal:        General: No swelling, tenderness, deformity or signs of injury. Normal range of motion.     Cervical back: Normal range of motion and neck supple.     Right lower leg: No edema.     Left lower leg: No edema.  Lymphadenopathy:     Cervical: No cervical adenopathy.  Skin:    General: Skin is warm and dry.     Coloration: Skin is not jaundiced or pale.     Findings: No bruising, erythema, lesion or rash.  Neurological:     General: No focal deficit present.     Mental Status: She is alert and oriented to person, place, and time.     Cranial Nerves: No cranial nerve deficit.     Sensory: No sensory deficit.     Motor: No weakness.     Coordination: Coordination normal.     Gait: Gait normal.     Deep Tendon Reflexes: Reflexes normal.  Psychiatric:        Mood and Affect: Mood normal.        Behavior: Behavior normal.        Thought Content: Thought content normal.        Judgment: Judgment normal.       Assessment & Plan:  1. Routine general medical examination at a health care facility - Remarkable 86 year old. No issues today  - Follow up in one year or sooner - She will get Covid and RSV vaccinations at the pharmacy  - CBC with Differential/Platelet; Future - Comprehensive metabolic panel; Future - Lipid panel; Future - TSH; Future  2. Hyperlipidemia, unspecified hyperlipidemia type - Consider increase in statin  - CBC with Differential/Platelet; Future - Comprehensive metabolic panel; Future - Lipid panel; Future - TSH; Future  3. Essential hypertension - Well controlled. No change in medications  - CBC with Differential/Platelet; Future - Comprehensive metabolic panel; Future - Lipid panel; Future - TSH; Future  4. Hemorrhoids, unspecified hemorrhoid type  - hydrocortisone (ANUSOL-HC) 25 MG suppository; Place 1 suppository (25 mg total) rectally 2 (two) times daily.   Dispense: 12 suppository; Refill: 6  Dorothyann Peng, NP

## 2022-11-15 IMAGING — MG MM DIGITAL SCREENING BILAT W/ TOMO AND CAD
8 series · 9 of 24 positions shown · non-contrast
Comparison: Previous exam(s).

CLINICAL DATA: Screening.

EXAM:
DIGITAL SCREENING BILATERAL MAMMOGRAM WITH TOMOSYNTHESIS AND CAD
TECHNIQUE: Bilateral screening digital craniocaudal and mediolateral oblique
mammograms were obtained. Bilateral screening digital breast
tomosynthesis was performed. The images were evaluated with
computer-aided detection.

[L MLO synth-2D]
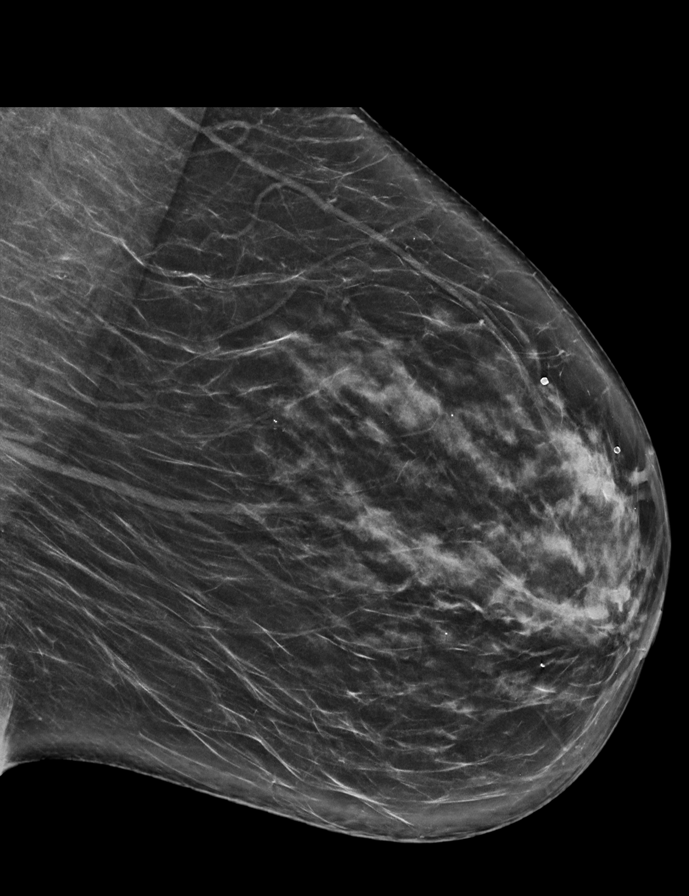

[R MLO synth-2D]
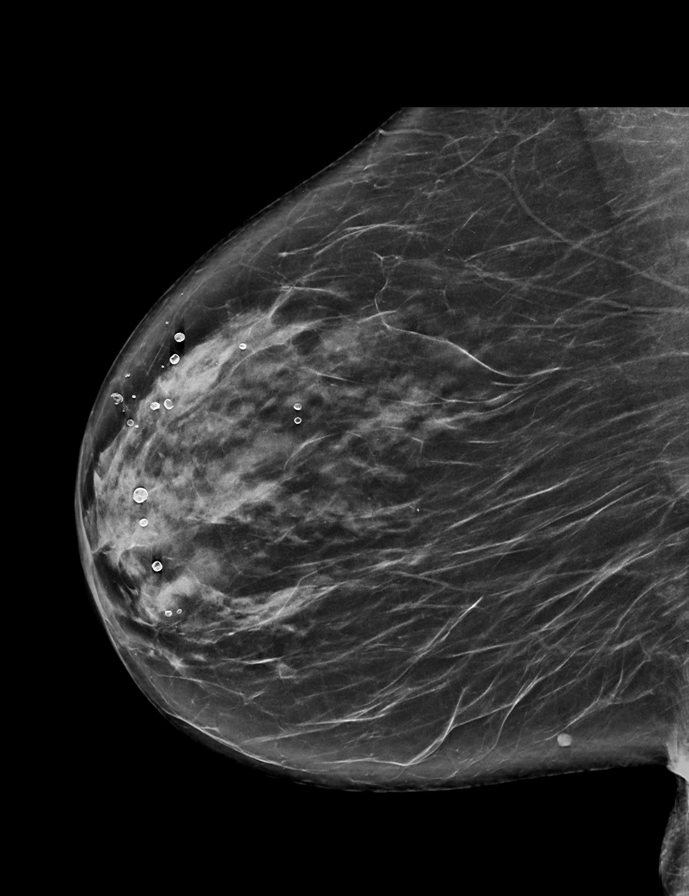

[R CC synth-2D]
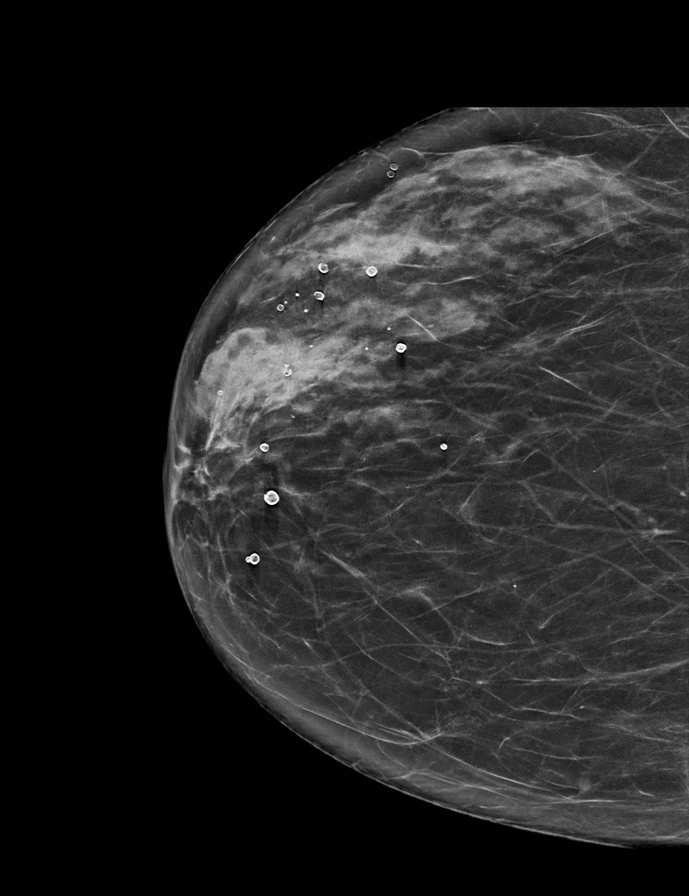

[L CC synth-2D]
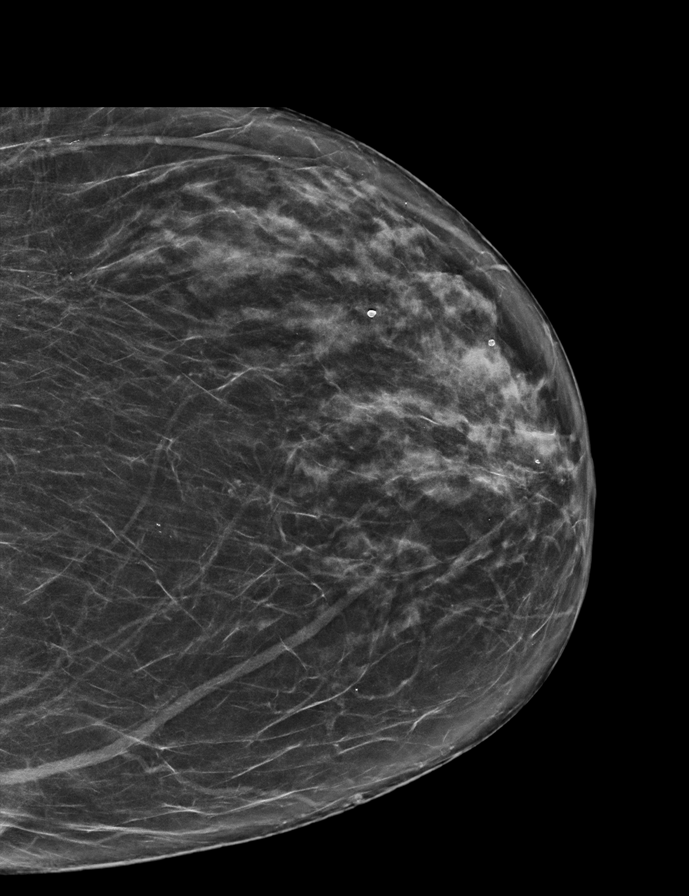

[R MLO tomo · 2 of 71 frames shown]
[frame 23/71]
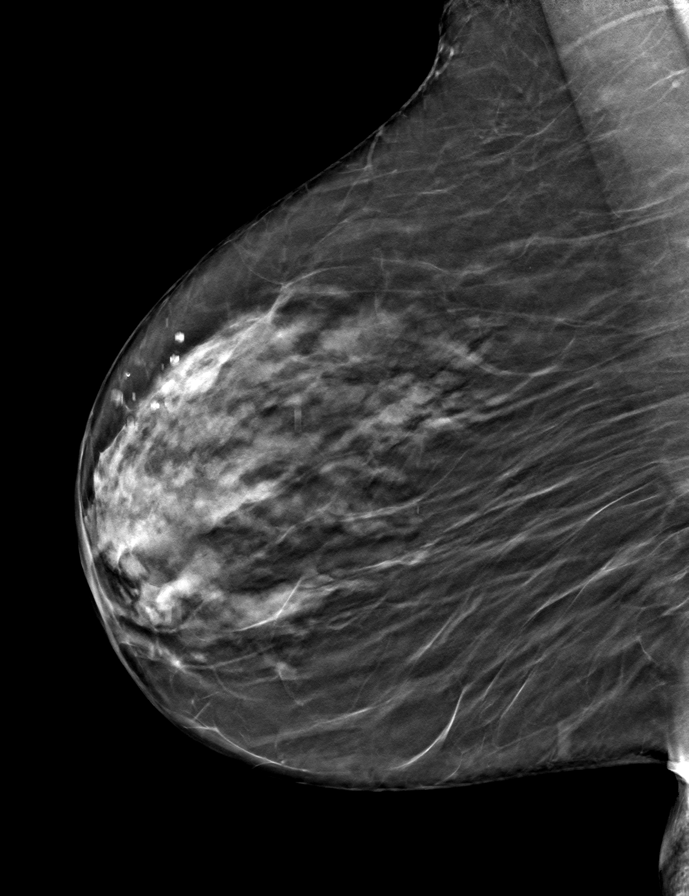
[frame 36/71]
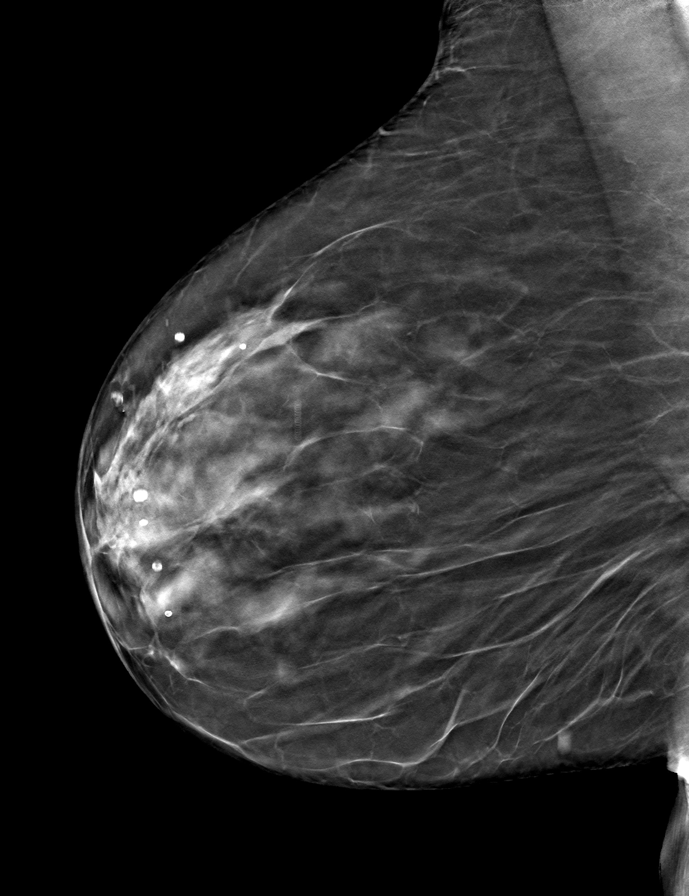

[L CC tomo · tomo slice 32/63.0]
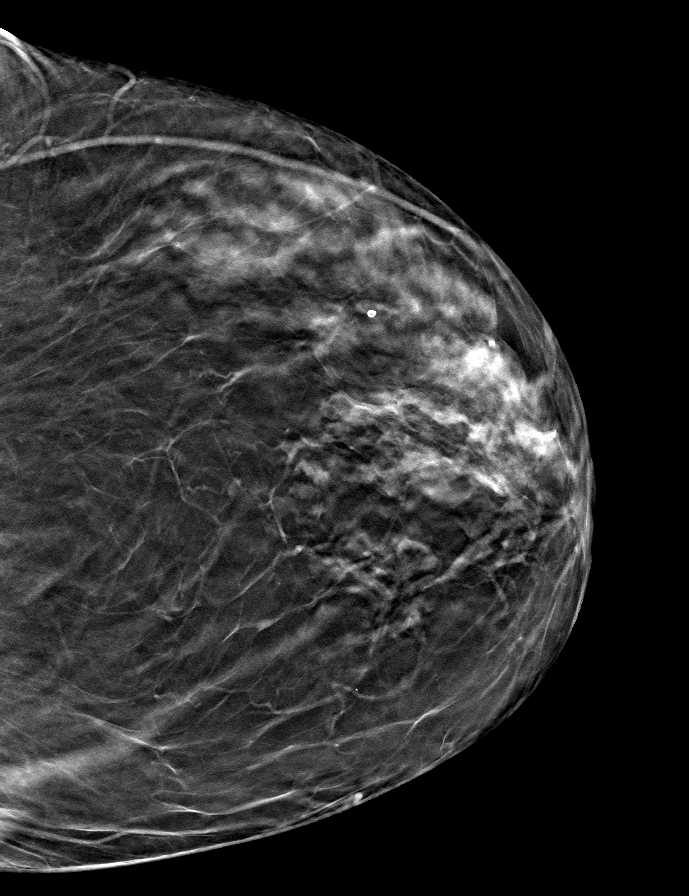

[L MLO tomo · tomo slice 35/69.0]
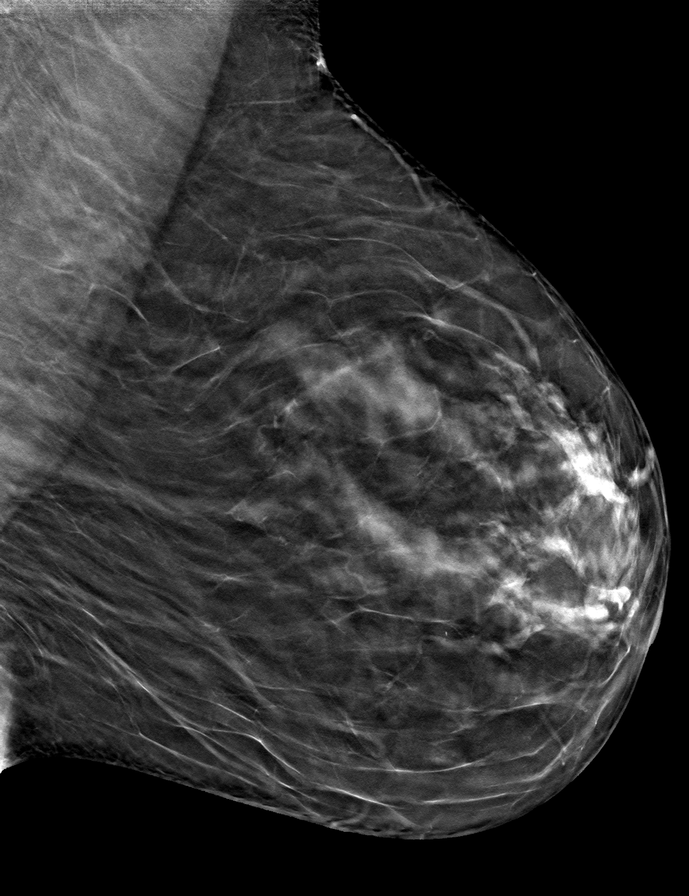

[R CC tomo · tomo slice 31/60.0]
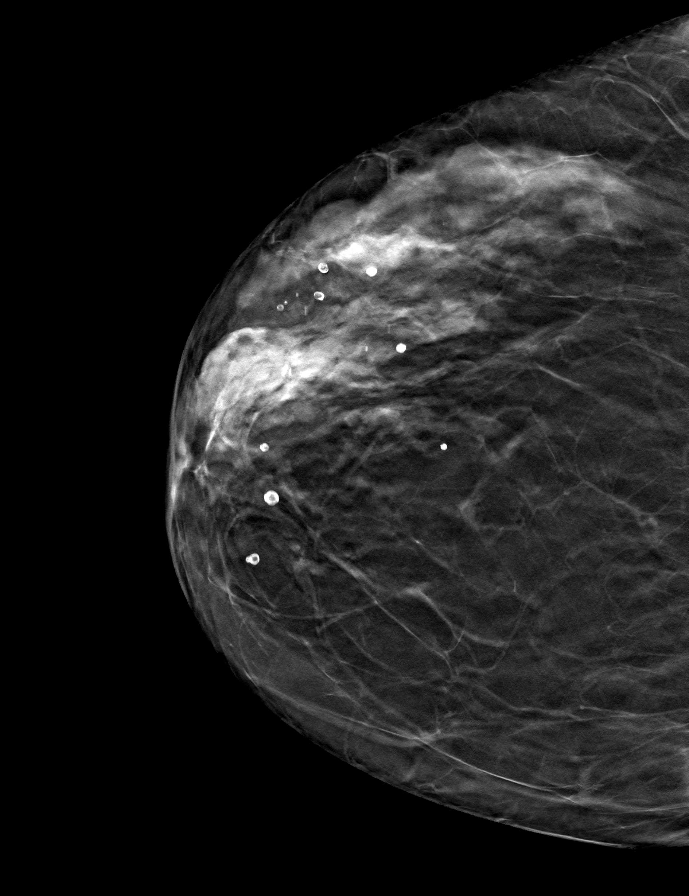

[9 of 24 positions shown; findings below may reference images not displayed]

ACR Breast Density Category c: The breast tissue is heterogeneously
dense, which may obscure small masses.
FINDINGS: There are no findings suspicious for malignancy.
IMPRESSION: No mammographic evidence of malignancy. A result letter of this
screening mammogram will be mailed directly to the patient.

RECOMMENDATION:
Screening mammogram in one year. (Code:Q3-W-BC3)

BI-RADS CATEGORY  1: Negative.

## 2022-12-31 ENCOUNTER — Other Ambulatory Visit: Payer: Self-pay | Admitting: Adult Health

## 2022-12-31 DIAGNOSIS — Z1231 Encounter for screening mammogram for malignant neoplasm of breast: Secondary | ICD-10-CM

## 2023-02-13 ENCOUNTER — Ambulatory Visit
Admission: RE | Admit: 2023-02-13 | Discharge: 2023-02-13 | Disposition: A | Discharge status: Home / Self Care | Payer: Medicare Other | Source: Ambulatory Visit | Attending: Adult Health | Admitting: Adult Health

## 2023-02-13 DIAGNOSIS — Z1231 Encounter for screening mammogram for malignant neoplasm of breast: Secondary | ICD-10-CM | POA: Diagnosis not present

## 2023-04-15 ENCOUNTER — Other Ambulatory Visit: Payer: Self-pay | Admitting: Adult Health

## 2023-04-15 DIAGNOSIS — E785 Hyperlipidemia, unspecified: Secondary | ICD-10-CM

## 2023-04-15 DIAGNOSIS — I10 Essential (primary) hypertension: Secondary | ICD-10-CM

## 2023-05-12 DIAGNOSIS — H40013 Open angle with borderline findings, low risk, bilateral: Secondary | ICD-10-CM | POA: Diagnosis not present

## 2023-05-12 DIAGNOSIS — H524 Presbyopia: Secondary | ICD-10-CM | POA: Diagnosis not present

## 2023-08-22 ENCOUNTER — Ambulatory Visit: Payer: Medicare Other | Admitting: Adult Health

## 2023-08-22 ENCOUNTER — Encounter: Payer: Self-pay | Admitting: Adult Health

## 2023-08-22 VITALS — BP 130/76 | HR 73 | Temp 98.2°F | Wt 122.0 lb

## 2023-08-22 DIAGNOSIS — R059 Cough, unspecified: Secondary | ICD-10-CM

## 2023-08-22 DIAGNOSIS — J302 Other seasonal allergic rhinitis: Secondary | ICD-10-CM

## 2023-08-22 LAB — POC COVID19 BINAXNOW: SARS Coronavirus 2 Ag: NEGATIVE

## 2023-08-22 NOTE — Progress Notes (Signed)
Subjective:    Patient ID: Gabriela Gomez, female    DOB: [birth year], 87 y.o.   MRN: [id number]  HPI 87 year old female who  has a past medical history of Adenomatous colon polyp, Allergy, Arthritis, Basal cell carcinoma (2017), and Hyperlipidemia.  She presents to the office today for an acute issue. She reports that over the last three days she has been experiencing head congestion, runny nose, and a semi productive cough. Her cough is improving. She denies fevers or chills. She does not feel acutely ill. She started taking claritin yesterday   Review of Systems See HPI   Past Medical History:  Diagnosis Date   Adenomatous colon polyp    Allergy    Arthritis    Basal cell carcinoma 2017   Left upper lip & right Ala Nasi   Hyperlipidemia     Social History   Socioeconomic History   Marital status: Widowed    Spouse name: Not on file   Number of children: Not on file   Years of education: Not on file   Highest education level: Not on file  Occupational History   Not on file  Tobacco Use   Smoking status: Never   Smokeless tobacco: Never  Vaping Use   Vaping status: Never Used  Substance and Sexual Activity   Alcohol use: No    Alcohol/week: 0.0 standard drinks of alcohol   Drug use: No   Sexual activity: Not on file  Other Topics Concern   Not on file  Social History Narrative   Widowed   Lives with daughter, husband and three grandchildren    [facility] and [facility].    Two cats as pets     Social Determinants of Health   Financial Resource Strain: Low Risk  (08/13/2022)   Overall Financial Resource Strain (CARDIA)    Difficulty of Paying Living Expenses: Not hard at all  Food Insecurity: No Food Insecurity (08/13/2022)   Hunger Vital Sign    Worried About Running Out of Food in the Last Year: Never true    Ran Out of Food in the Last Year: Never true  Transportation Needs: No Transportation Needs (08/13/2022)   PRAPARE - Scientist, research (physical sciences) (Medical): No    Lack of Transportation (Non-Medical): No  Physical Activity: Sufficiently Active (08/13/2022)   Exercise Vital Sign    Days of Exercise per Week: 3 days    Minutes of Exercise per Session: 60 min  Stress: No Stress Concern Present (08/13/2022)   [employer] of Occupational Health - Occupational Stress Questionnaire    Feeling of Stress : Not at all  Social Connections: Moderately Integrated (07/31/2021)   Social Connection and Isolation Panel [NHANES]    Frequency of Communication with Friends and Family: More than three times a week    Frequency of Social Gatherings with Friends and Family: More than three times a week    Attends Religious Services: More than 4 times per year    Active Member of [employer] or Organizations: Yes    Attends Banker Meetings: 1 to 4 times per year    Marital Status: Widowed  Intimate Partner Violence: Not At Risk (07/31/2021)   Humiliation, Afraid, Rape, and Kick questionnaire    Fear of Current or Ex-Partner: No    Emotionally Abused: No    Physically Abused: No    Sexually Abused: No    Past Surgical History:  Procedure Laterality Date   APPENDECTOMY     CATARACT EXTRACTION, BILATERAL Bilateral 08/2021    Family History  Problem Relation Age of Onset   Cancer Mother        throat   Diabetes Father    Heart disease Father    Heart attack Father    Colon cancer Neg Hx    Esophageal cancer Neg Hx    Rectal cancer Neg Hx    Stomach cancer Neg Hx     No Known Allergies  Current Outpatient Medications on File Prior to Visit  Medication Sig Dispense Refill   atorvastatin (LIPITOR) 20 MG tablet TAKE 1 TABLET(20 MG) BY MOUTH DAILY 90 tablet 1   Calcium 600-200 MG-UNIT tablet Take 1 tablet by mouth daily.     hydrocortisone (ANUSOL-HC) 25 MG suppository Place 1 suppository (25 mg total) rectally 2 (two) times daily. 12 suppository 6   lisinopril (ZESTRIL) 5 MG tablet TAKE 1 TABLET(5 MG) BY MOUTH DAILY  90 tablet 1   multivitamin (THERAGRAN) per tablet Take 1 tablet by mouth daily.     psyllium (METAMUCIL) 58.6 % packet Take 1 packet by mouth as needed.     No current facility-administered medications on file prior to visit.    BP 130/76 (BP Location: Left Arm, Patient Position: Sitting, Cuff Size: Normal)   Pulse 73   Temp 98.2 F (36.8 C) (Oral)   Wt 122 lb (55.3 kg)   SpO2 98%   BMI 23.83 kg/m       Objective:   Physical Exam Vitals and nursing note reviewed.  Constitutional:      Appearance: Normal appearance.  HENT:     Nose: No congestion or rhinorrhea.     Right Turbinates: Not enlarged or swollen.     Left Turbinates: Not enlarged or swollen.     Right Sinus: No maxillary sinus tenderness or frontal sinus tenderness.     Left Sinus: No maxillary sinus tenderness or frontal sinus tenderness.     Mouth/Throat:     Mouth: Mucous membranes are moist.     Pharynx: Oropharynx is clear.  Cardiovascular:     Rate and Rhythm: Normal rate and regular rhythm.     Pulses: Normal pulses.     Heart sounds: Normal heart sounds.  Pulmonary:     Effort: Pulmonary effort is normal.     Breath sounds: Normal breath sounds.  Musculoskeletal:        General: Normal range of motion.  Skin:    General: Skin is warm and dry.  Neurological:     General: No focal deficit present.     Mental Status: She is alert and oriented to person, place, and time.  Psychiatric:        Mood and Affect: Mood normal.        Behavior: Behavior normal.        Thought Content: Thought content normal.        Judgment: Judgment normal.       Assessment & Plan:  1. Seasonal allergies - Her symptoms are consistent with seasonal allergies. She can continue to take claritin.  - Follow up if symptoms worsen or fever develops.   2. Cough, unspecified type  - POC COVID-19- negative

## 2023-09-30 DIAGNOSIS — D225 Melanocytic nevi of trunk: Secondary | ICD-10-CM | POA: Diagnosis not present

## 2023-09-30 DIAGNOSIS — L821 Other seborrheic keratosis: Secondary | ICD-10-CM | POA: Diagnosis not present

## 2023-09-30 DIAGNOSIS — L814 Other melanin hyperpigmentation: Secondary | ICD-10-CM | POA: Diagnosis not present

## 2023-09-30 DIAGNOSIS — E782 Mixed hyperlipidemia: Secondary | ICD-10-CM | POA: Diagnosis not present

## 2023-10-03 ENCOUNTER — Encounter: Payer: Self-pay | Admitting: Adult Health

## 2023-10-03 ENCOUNTER — Ambulatory Visit: Payer: Medicare Other | Admitting: Adult Health

## 2023-10-03 VITALS — BP 120/60 | HR 61 | Temp 97.9°F | Ht 62.0 in | Wt 120.0 lb

## 2023-10-03 DIAGNOSIS — K649 Unspecified hemorrhoids: Secondary | ICD-10-CM

## 2023-10-03 DIAGNOSIS — Z0000 Encounter for general adult medical examination without abnormal findings: Secondary | ICD-10-CM

## 2023-10-03 DIAGNOSIS — I10 Essential (primary) hypertension: Secondary | ICD-10-CM | POA: Diagnosis not present

## 2023-10-03 DIAGNOSIS — E785 Hyperlipidemia, unspecified: Secondary | ICD-10-CM

## 2023-10-03 LAB — CBC WITH DIFFERENTIAL/PLATELET
Basophils Absolute: 0 10*3/uL (ref 0.0–0.1)
Basophils Relative: 0.8 % (ref 0.0–3.0)
Eosinophils Absolute: 0 10*3/uL (ref 0.0–0.7)
Eosinophils Relative: 0.9 % (ref 0.0–5.0)
HCT: 43.1 % (ref 36.0–46.0)
Hemoglobin: 14.3 g/dL (ref 12.0–15.0)
Lymphocytes Relative: 32.2 % (ref 12.0–46.0)
Lymphs Abs: 1.5 10*3/uL (ref 0.7–4.0)
MCHC: 33.3 g/dL (ref 30.0–36.0)
MCV: 95 fL (ref 78.0–100.0)
Monocytes Absolute: 0.3 10*3/uL (ref 0.1–1.0)
Monocytes Relative: 5.8 % (ref 3.0–12.0)
Neutro Abs: 2.8 10*3/uL (ref 1.4–7.7)
Neutrophils Relative %: 60.3 % (ref 43.0–77.0)
Platelets: 247 10*3/uL (ref 150.0–400.0)
RBC: 4.53 Mil/uL (ref 3.87–5.11)
RDW: 13.4 % (ref 11.5–15.5)
WBC: 4.6 10*3/uL (ref 4.0–10.5)

## 2023-10-03 LAB — TSH: TSH: 2.21 u[IU]/mL (ref 0.35–5.50)

## 2023-10-03 LAB — LIPID PANEL
Cholesterol: 157 mg/dL (ref 0–200)
HDL: 55.6 mg/dL (ref >39.00)
LDL Cholesterol: 89 mg/dL (ref 0–99)
NonHDL: 100.96
Total CHOL/HDL Ratio: 3
Triglycerides: 58 mg/dL (ref 0.0–149.0)
VLDL: 11.6 mg/dL (ref 0.0–40.0)

## 2023-10-03 LAB — COMPREHENSIVE METABOLIC PANEL
ALT: 16 U/L (ref 0–35)
AST: 22 U/L (ref 0–37)
Albumin: 4.3 g/dL (ref 3.5–5.2)
Alkaline Phosphatase: 76 U/L (ref 39–117)
BUN: 13 mg/dL (ref 6–23)
CO2: 32 meq/L (ref 19–32)
Calcium: 9.5 mg/dL (ref 8.4–10.5)
Chloride: 101 meq/L (ref 96–112)
Creatinine, Ser: 0.67 mg/dL (ref 0.40–1.20)
GFR: 79.8 mL/min (ref >60.00)
Glucose, Bld: 107 mg/dL — ABNORMAL HIGH (ref 70–99)
Potassium: 4.2 meq/L (ref 3.5–5.1)
Sodium: 140 meq/L (ref 135–145)
Total Bilirubin: 0.7 mg/dL (ref 0.2–1.2)
Total Protein: 7.2 g/dL (ref 6.0–8.3)

## 2023-10-03 MED ORDER — ATORVASTATIN CALCIUM 20 MG PO TABS: 20.0000 mg | ORAL_TABLET | Freq: Every day | ORAL | 3 refills | Status: DC

## 2023-10-03 MED ORDER — LISINOPRIL 5 MG PO TABS: 5.0000 mg | ORAL_TABLET | Freq: Every day | ORAL | 3 refills | Status: DC

## 2023-10-03 MED ORDER — HYDROCORTISONE ACETATE 25 MG RE SUPP: 25.0000 mg | Freq: Two times a day (BID) | RECTAL | 6 refills | Status: AC

## 2023-10-03 NOTE — Progress Notes (Signed)
Subjective:    Patient ID: Gabriela Gomez, female    DOB: July 25, 1936, 87 y.o.   MRN: [id number]  HPI  Patient presents for yearly preventative medicine examination. She is a pleasant 87 year old female who  has a past medical history of Adenomatous colon polyp, Allergy, Arthritis, Basal cell carcinoma (2017), and Hyperlipidemia.  Essential  hypertension-managed with low-dose lisinopril at 5 mg daily.  She does monitor her blood pressures at home and consistently has readings in the 120s to 130s over 80s.  She does have some elevation of her blood pressure when she comes to her medical appointments.  She denies dizziness, lightheadedness, chest pain, shortness of breath, or syncopal episodes BP Readings from Last 3 Encounters:  10/03/23 120/60  08/22/23 130/76  10/01/22 130/70   Hyperlipidemia-managed with Lipitor 20 mg daily.  She denies myalgia or fatigue Lab Results  Component Value Date   CHOL 164 10/01/2022   HDL 61.00 10/01/2022   LDLCALC 89 10/01/2022   LDLDIRECT 139.1 08/19/2007   TRIG 67.0 10/01/2022   CHOLHDL 3 10/01/2022    History of skin cancer-is seen by dermatology on a routine basis. She was recently seen and had no skin cancer detected.   Hemorrhoids - she will use Anusol suppositories as needed.Last prescribed in 2021.   All immunizations and health maintenance protocols were reviewed with the patient and needed orders were placed.  Appropriate screening laboratory values were ordered for the patient including screening of hyperlipidemia, renal function and hepatic function.   Medication reconciliation,  past medical history, social history, problem list and allergies were reviewed in detail with the patient  Goals were established with regard to weight loss, exercise, and  diet in compliance with medications. She continues to go to the [facility] multiple times a week and eats healthy.   Wt Readings from Last 3 Encounters:  10/03/23 120 lb (54.4 kg)  08/22/23 122 lb  (55.3 kg)  10/01/22 132 lb (59.9 kg)   She has no acute complaints today    Review of Systems  Constitutional: Negative.   HENT: Negative.    Eyes: Negative.   Respiratory: Negative.    Cardiovascular: Negative.   Gastrointestinal: Negative.   Endocrine: Negative.   Genitourinary: Negative.   Musculoskeletal: Negative.   Skin: Negative.   Allergic/Immunologic: Negative.   Neurological: Negative.   Hematological: Negative.   Psychiatric/Behavioral: Negative.     Past Medical History:  Diagnosis Date   Adenomatous colon polyp    Allergy    Arthritis    Basal cell carcinoma 2017   Left upper lip & right Ala Nasi   Hyperlipidemia     Social History   Socioeconomic History   Marital status: Widowed    Spouse name: Not on file   Number of children: Not on file   Years of education: Not on file   Highest education level: Not on file  Occupational History   Not on file  Tobacco Use   Smoking status: Never   Smokeless tobacco: Never  Vaping Use   Vaping status: Never Used  Substance and Sexual Activity   Alcohol use: No    Alcohol/week: 0.0 standard drinks of alcohol   Drug use: No   Sexual activity: Not on file  Other Topics Concern   Not on file  Social History Narrative   Widowed   Lives with daughter, husband and three grandchildren    [facility] and [facility].    Two  cats as pets     Social Determinants of Health   Financial Resource Strain: Low Risk  (08/13/2022)   Overall Financial Resource Strain (CARDIA)    Difficulty of Paying Living Expenses: Not hard at all  Food Insecurity: No Food Insecurity (08/13/2022)   Hunger Vital Sign    Worried About Running Out of Food in the Last Year: Never true    Ran Out of Food in the Last Year: Never true  Transportation Needs: No Transportation Needs (08/13/2022)   PRAPARE - Administrator, Civil Service (Medical): No    Lack of Transportation (Non-Medical): No  Physical Activity:  Sufficiently Active (08/13/2022)   Exercise Vital Sign    Days of Exercise per Week: 3 days    Minutes of Exercise per Session: 60 min  Stress: No Stress Concern Present (08/13/2022)   [employer] of Occupational Health - Occupational Stress Questionnaire    Feeling of Stress : Not at all  Social Connections: Moderately Integrated (07/31/2021)   Social Connection and Isolation Panel [NHANES]    Frequency of Communication with Friends and Family: More than three times a week    Frequency of Social Gatherings with Friends and Family: More than three times a week    Attends Religious Services: More than 4 times per year    Active Member of [employer] or Organizations: Yes    Attends Banker Meetings: 1 to 4 times per year    Marital Status: Widowed  Intimate Partner Violence: Not At Risk (07/31/2021)   Humiliation, Afraid, Rape, and Kick questionnaire    Fear of Current or Ex-Partner: No    Emotionally Abused: No    Physically Abused: No    Sexually Abused: No    Past Surgical History:  Procedure Laterality Date   APPENDECTOMY     CATARACT EXTRACTION, BILATERAL Bilateral 08/2021    Family History  Problem Relation Age of Onset   Cancer Mother        throat   Diabetes Father    Heart disease Father    Heart attack Father    Colon cancer Neg Hx    Esophageal cancer Neg Hx    Rectal cancer Neg Hx    Stomach cancer Neg Hx     No Known Allergies  Current Outpatient Medications on File Prior to Visit  Medication Sig Dispense Refill   Calcium 600-200 MG-UNIT tablet Take 1 tablet by mouth daily.     multivitamin (THERAGRAN) per tablet Take 1 tablet by mouth daily.     psyllium (METAMUCIL) 58.6 % packet Take 1 packet by mouth as needed.     No current facility-administered medications on file prior to visit.    BP 120/60   Pulse 61   Temp 97.9 F (36.6 C) (Oral)   Ht 5\' 2"  (1.575 m)   Wt 120 lb (54.4 kg)   SpO2 97%   BMI 21.95 kg/m       Objective:    Physical Exam Vitals and nursing note reviewed.  Constitutional:      General: She is not in acute distress.    Appearance: Normal appearance. She is not ill-appearing.  HENT:     Head: Normocephalic and atraumatic.     Right Ear: Tympanic membrane, ear canal and external ear normal. There is no impacted cerumen.     Left Ear: Tympanic membrane, ear canal and external ear normal. There is no impacted cerumen.     Nose: Nose normal.  No congestion or rhinorrhea.     Mouth/Throat:     Mouth: Mucous membranes are moist.     Pharynx: Oropharynx is clear.  Eyes:     Extraocular Movements: Extraocular movements intact.     Conjunctiva/sclera: Conjunctivae normal.     Pupils: Pupils are equal, round, and reactive to light.  Neck:     Vascular: No carotid bruit.  Cardiovascular:     Rate and Rhythm: Normal rate and regular rhythm.     Pulses: Normal pulses.     Heart sounds: No murmur heard.    No friction rub. No gallop.  Pulmonary:     Effort: Pulmonary effort is normal.     Breath sounds: Normal breath sounds.  Abdominal:     General: Abdomen is flat. Bowel sounds are normal. There is no distension.     Palpations: Abdomen is soft. There is no mass.     Tenderness: There is no abdominal tenderness. There is no guarding or rebound.     Hernia: No hernia is present.  Musculoskeletal:        General: Normal range of motion.     Cervical back: Normal range of motion and neck supple.  Lymphadenopathy:     Cervical: No cervical adenopathy.  Skin:    General: Skin is warm and dry.     Capillary Refill: Capillary refill takes less than 2 seconds.  Neurological:     General: No focal deficit present.     Mental Status: She is alert and oriented to person, place, and time.  Psychiatric:        Mood and Affect: Mood normal.        Behavior: Behavior normal.        Thought Content: Thought content normal.        Judgment: Judgment normal.       Assessment & Plan:  1. Routine general  medical examination at a health care facility Today patient counseled on age appropriate routine health concerns for screening and prevention, each reviewed and up to date or declined. Immunizations reviewed and up to date or declined. Labs ordered and reviewed. Risk factors for depression reviewed and negative. Hearing function and visual acuity are intact. ADLs screened and addressed as needed. Functional ability and level of safety reviewed and appropriate. Education, counseling and referrals performed based on assessed risks today. Patient provided with a copy of personalized plan for preventive services. - Continue to stay active and eat healthy  - Follow up in one year or sooner if needed  2. Hyperlipidemia, unspecified hyperlipidemia type - Continue with lipitor 20 mg daily.  - CBC with Differential/Platelet; Future - Comprehensive metabolic panel; Future - Lipid panel; Future - TSH; Future - atorvastatin (LIPITOR) 20 MG tablet; Take 1 tablet (20 mg total) by mouth daily.  Dispense: 90 tablet; Refill: 3  3. Essential hypertension - Well controlled. Continue with lisinopril 5 mg daily  - CBC with Differential/Platelet; Future - Comprehensive metabolic panel; Future - Lipid panel; Future - TSH; Future - lisinopril (ZESTRIL) 5 MG tablet; Take 1 tablet (5 mg total) by mouth daily.  Dispense: 90 tablet; Refill: 3  4. Hemorrhoids, unspecified hemorrhoid type  - hydrocortisone (ANUSOL-HC) 25 MG suppository; Place 1 suppository (25 mg total) rectally 2 (two) times daily.  Dispense: 12 suppository; Refill: 6  Shirline Frees, NP

## 2023-10-07 ENCOUNTER — Other Ambulatory Visit: Payer: Self-pay | Admitting: Adult Health

## 2023-10-07 DIAGNOSIS — E785 Hyperlipidemia, unspecified: Secondary | ICD-10-CM

## 2023-10-07 DIAGNOSIS — I10 Essential (primary) hypertension: Secondary | ICD-10-CM

## 2023-10-07 NOTE — Telephone Encounter (Signed)
Pharm is calling checking on the status of lisinopril

## 2024-01-02 ENCOUNTER — Other Ambulatory Visit: Payer: Self-pay | Admitting: Emergency Medicine

## 2024-01-02 DIAGNOSIS — Z1231 Encounter for screening mammogram for malignant neoplasm of breast: Secondary | ICD-10-CM

## 2024-02-16 ENCOUNTER — Ambulatory Visit
Admission: RE | Admit: 2024-02-16 | Discharge: 2024-02-16 | Disposition: A | Discharge status: Home / Self Care | Payer: Medicare Other | Source: Ambulatory Visit | Attending: Emergency Medicine | Admitting: Emergency Medicine

## 2024-02-16 DIAGNOSIS — Z1231 Encounter for screening mammogram for malignant neoplasm of breast: Secondary | ICD-10-CM | POA: Diagnosis not present

## 2024-07-09 ENCOUNTER — Ambulatory Visit

## 2024-07-09 VITALS — BP 120/60 | HR 60 | Temp 97.7°F | Ht 62.0 in | Wt 114.3 lb

## 2024-07-09 DIAGNOSIS — Z0000 Encounter for general adult medical examination without abnormal findings: Secondary | ICD-10-CM

## 2024-07-09 NOTE — Patient Instructions (Addendum)
 Gabriela Gomez , Thank you for taking time out of your busy schedule to complete your Annual Wellness Visit with me. I enjoyed our conversation and look forward to speaking with you again next year. I, as well as your care team,  appreciate your ongoing commitment to your health goals. Please review the following plan we discussed and let me know if I can assist you in the future. Your Game plan/ To Do List    Referrals: If you haven't heard from the office you've been referred to, please reach out to them at the phone provided.   Follow up Visits: We will see or speak with you next year for your Next Medicare AWV with our clinical staff 07/15/25 @ 10:40a  Have you seen your provider in the last 6 months (3 months if uncontrolled diabetes)? Next appointment with provider 10/05/24 @ 10:30a  Clinician Recommendations:  Aim for 30 minutes of exercise or brisk walking, 6-8 glasses of water, and 5 servings of fruits and vegetables each day.       This is a list of the screenings recommended for you:  Health Maintenance  Topic Date Due   COVID-19 Vaccine (8 - 2024-25 season) 03/19/2024   Flu Shot  06/11/2024   Medicare Annual Wellness Visit  07/09/2025   DTaP/Tdap/Td vaccine (3 - Td or Tdap) 10/13/2033   Pneumococcal Vaccine for age over 30  Completed   DEXA scan (bone density measurement)  Completed   Zoster (Shingles) Vaccine  Completed   HPV Vaccine  Aged Out   Meningitis B Vaccine  Aged Out    Advanced directives: (Declined) Advance directive discussed with you today. Even though you declined this today, please call our office should you change your mind, and we can give you the proper paperwork for you to fill out. Advance Care Planning is important because it:  [x]  Makes sure you receive the medical care that is consistent with your values, goals, and preferences  [x]  It provides guidance to your family and loved ones and reduces their decisional burden about whether or not they are  making the right decisions based on your wishes.  Follow the link provided in your after visit summary or read over the paperwork we have mailed to you to help you started getting your Advance Directives in place. If you need assistance in completing these, please reach out to us  so that we can help you!  See attachments for Preventive Care and Fall Prevention Tips.

## 2024-07-09 NOTE — Progress Notes (Signed)
 Subjective:   Gabriela Gomez is a 88 y.o. who presents for a Medicare Wellness preventive visit.  As a reminder, Annual Wellness Visits don't include a physical exam, and some assessments may be limited, especially if this visit is performed virtually. We may recommend an in-person follow-up visit with your provider if needed.  Visit Complete: In person    Persons Participating in Visit: Patient.  AWV Questionnaire: No: Patient Medicare AWV questionnaire was not completed prior to this visit.  Cardiac Risk Factors include: advanced age (>62men, >78 women);hypertension     Objective:    Today's Vitals   07/09/24 1027  BP: 120/60  Pulse: 60  Temp: 97.7 F (36.5 C)  TempSrc: Oral  SpO2: 97%  Weight: 114 lb 4.8 oz (51.8 kg)  Height: 5' 2 (1.575 m)   Body mass index is 20.91 kg/m.     07/09/2024   10:47 AM 08/13/2022    2:34 PM 07/31/2021    2:26 PM  Advanced Directives  Does Patient Have a Medical Advance Directive? No No No  Would patient like information on creating a medical advance directive? No - Patient declined  Yes (MAU/Ambulatory/Procedural Areas - Information given)    Current Medications (verified) Outpatient Encounter Medications as of 07/09/2024  Medication Sig   atorvastatin  (LIPITOR) 20 MG tablet Take 1 tablet (20 mg total) by mouth daily.   Calcium  600-200 MG-UNIT tablet Take 1 tablet by mouth daily.   hydrocortisone  (ANUSOL -HC) 25 MG suppository Place 1 suppository (25 mg total) rectally 2 (two) times daily.   lisinopril  (ZESTRIL ) 5 MG tablet TAKE 1 TABLET(5 MG) BY MOUTH DAILY   multivitamin (THERAGRAN) per tablet Take 1 tablet by mouth daily.   psyllium (METAMUCIL) 58.6 % packet Take 1 packet by mouth as needed.   No facility-administered encounter medications on file as of 07/09/2024.    Allergies (verified) Patient has no known allergies.   History: Past Medical History:  Diagnosis Date   Adenomatous colon polyp    Allergy    Arthritis     Basal cell carcinoma 2017   Left upper lip & right [place]   Hyperlipidemia    Past Surgical History:  Procedure Laterality Date   APPENDECTOMY     CATARACT EXTRACTION, BILATERAL Bilateral 08/2021   Family History  Problem Relation Age of Onset   Throat cancer Mother    Diabetes Father    Heart disease Father    Heart attack Father    Colon cancer Neg Hx    Esophageal cancer Neg Hx    Rectal cancer Neg Hx    Stomach cancer Neg Hx    BRCA 1/2 Neg Hx    Breast cancer Neg Hx    Social History   Socioeconomic History   Marital status: Widowed    Spouse name: Not on file   Number of children: Not on file   Years of education: Not on file   Highest education level: Not on file  Occupational History   Not on file  Tobacco Use   Smoking status: Never   Smokeless tobacco: Never  Vaping Use   Vaping status: Never Used  Substance and Sexual Activity   Alcohol use: No    Alcohol/week: 0.0 standard drinks of alcohol   Drug use: No   Sexual activity: Not on file  Other Topics Concern   Not on file  Social History Narrative   Widowed   Lives with daughter, husband and three grandchildren    [facility] and [facility].    Two cats as pets     Social Drivers of Corporate investment banker Strain: Low Risk  (07/09/2024)   Overall Financial Resource Strain (CARDIA)    Difficulty of Paying Living Expenses: Not hard at all  Food Insecurity: No Food Insecurity (07/09/2024)   Hunger Vital Sign    Worried About Running Out of Food in the Last Year: Never true    Ran Out of Food in the Last Year: Never true  Transportation Needs: No Transportation Needs (07/09/2024)   PRAPARE - Administrator, Civil Service (Medical): No    Lack of Transportation (Non-Medical): No  Physical Activity: Sufficiently Active (07/09/2024)   Exercise Vital Sign    Days of Exercise per Week: 4 days    Minutes of Exercise per Session: 100 min  Stress: No Stress Concern Present  (07/09/2024)   [employer] of Occupational Health - Occupational Stress Questionnaire    Feeling of Stress: Not at all  Social Connections: Moderately Integrated (07/09/2024)   Social Connection and Isolation Panel    Frequency of Communication with Friends and Family: More than three times a week    Frequency of Social Gatherings with Friends and Family: More than three times a week    Attends Religious Services: More than 4 times per year    Active Member of [employer] or Organizations: Yes    Attends Banker Meetings: More than 4 times per year    Marital Status: Widowed    Tobacco Counseling Counseling given: Not Answered    Clinical Intake:  Pre-visit preparation completed: Yes  Pain : No/denies pain     BMI - recorded: 20.91 Nutritional Status: BMI of 19-24  Normal Nutritional Risks: None Diabetes: No  Lab Results  Component Value Date   HGBA1C 6.3 03/16/2015   HGBA1C 6.5 02/14/2014   HGBA1C 6.5 02/12/2013     How often do you need to have someone help you when you read instructions, pamphlets, or other written materials from your doctor or pharmacy?: 1 - Never  Interpreter Needed?: No  Information entered by :: Rojelio Blush LPN   Activities of Daily Living     07/09/2024   10:45 AM 10/03/2023   11:24 AM  In your present state of health, do you have any difficulty performing the following activities:  Hearing? 1 0  Comment Wears Hearing Aids   Vision? 0 0  Difficulty concentrating or making decisions? 0 0  Walking or climbing stairs? 0 0  Dressing or bathing? 0 0  Doing errands, shopping? 0 0  Preparing Food and eating ? N   Using the Toilet? N   In the past six months, have you accidently leaked urine? N   Do you have problems with loss of bowel control? N   Managing your Medications? N   Managing your Finances? N   Housekeeping or managing your Housekeeping? N     Patient Care Team: Merna Huxley, NP as PCP - General (Family  Medicine)  I have updated your Care Teams any recent Medical Services you may have received from other providers in the past year.     Assessment:   This is a routine wellness examination for Gabriela Gomez.  Hearing/Vision screen Hearing Screening - Comments:: Wears Hearing Aids Vision Screening - Comments:: Wears rx glasses - up to date with routine eye exams with  [facility]   Goals Addressed  This Visit's Progress     Continue physical activity (pt-stated)        Remain active       Depression Screen     07/09/2024   10:31 AM 10/03/2023   11:24 AM 10/01/2022   10:55 AM 08/13/2022    2:34 PM 07/31/2021    2:25 PM 09/26/2020    8:58 AM 09/03/2018    9:51 AM  PHQ 2/9 Scores  PHQ - 2 Score 0 0 0 0 0 0 0  PHQ- 9 Score   0        Fall Risk     07/09/2024   10:46 AM 10/03/2023   11:23 AM 10/01/2022   10:55 AM 08/13/2022    2:34 PM 07/31/2021    2:28 PM  Fall Risk   Falls in the past year? 0 0 0 0 0  Number falls in past yr: 0 0 0 0 0  Injury with Fall? 0 0 0 0 0  Risk for fall due to : No Fall Risks No Fall Risks No Fall Risks Medication side effect Impaired vision  Follow up Falls evaluation completed Falls evaluation completed Falls evaluation completed  Falls prevention discussed;Education provided;Falls evaluation completed  Falls prevention discussed      Data saved with a previous flowsheet row definition    MEDICARE RISK AT HOME:  Medicare Risk at Home Any stairs in or around the home?: No If so, are there any without handrails?: No Home free of loose throw rugs in walkways, pet beds, electrical cords, etc?: Yes Adequate lighting in your home to reduce risk of falls?: Yes Life alert?: No Use of a cane, walker or w/c?: No Grab bars in the bathroom?: Yes Shower chair or bench in shower?: No Elevated toilet seat or a handicapped toilet?: No  TIMED UP AND GO:  Was the test performed?  Yes  Length of time to ambulate 10 feet: 10 sec Gait  steady and fast without use of assistive device  Cognitive Function: 6CIT completed        07/09/2024   10:47 AM 08/13/2022    2:37 PM 07/31/2021    2:30 PM  6CIT Screen  What Year? 0 points 0 points 0 points  What month? 0 points 0 points 0 points  What time? 0 points 0 points 0 points  Count back from 20 0 points 0 points 0 points  Months in reverse 0 points 2 points 2 points  Repeat phrase 0 points 6 points 4 points  Total Score 0 points 8 points 6 points    Immunizations Immunization History  Administered Date(s) Administered   Fluad Quad(high Dose 65+) 08/17/2019, 07/29/2022   INFLUENZA, HIGH DOSE SEASONAL PF 08/18/2013, 08/11/2015, 08/29/2016, 08/20/2017, 08/27/2018, 07/31/2023   Influenza Split 08/16/2011, 08/14/2012   Influenza Whole 08/12/2008, 08/17/2009, 07/23/2010   Influenza,inj,Quad PF,6+ Mos 08/25/2014   Influenza-Unspecified 08/11/2020, 09/04/2021, 07/29/2022, 07/30/2023   PFIZER Comirnaty(Gray Top)Covid-19 Tri-Sucrose Vaccine 03/02/2021   PFIZER(Purple Top)SARS-COV-2 Vaccination 11/21/2019, 12/12/2019, 08/17/2020, 08/27/2021   Pfizer(Comirnaty)Fall Seasonal Vaccine 12 years and older 11/20/2022, 09/20/2023   Pneumococcal Conjugate-13 03/13/2015   Pneumococcal Polysaccharide-23 11/12/2003   Rotavirus,unspecified  10/05/2022   Tdap 08/02/2011, 10/14/2023   Zoster Recombinant(Shingrix) 10/11/2021, 02/13/2022   Zoster, Live 02/11/2012    Screening Tests Health Maintenance  Topic Date Due   COVID-19 Vaccine (8 - 2024-25 season) 03/19/2024   INFLUENZA VACCINE  06/11/2024   Medicare Annual Wellness (AWV)  07/09/2025   DTaP/Tdap/Td (3 - Td or Tdap) 10/13/2033  Pneumococcal Vaccine: 50+ Years  Completed   DEXA SCAN  Completed   Zoster Vaccines- Shingrix  Completed   HPV VACCINES  Aged Out   Meningococcal B Vaccine  Aged Out    Health Maintenance  Health Maintenance Due  Topic Date Due   COVID-19 Vaccine (8 - 2024-25 season) 03/19/2024   INFLUENZA  VACCINE  06/11/2024   Health Maintenance Items Addressed:   Additional Screening:  Vision Screening: Recommended annual ophthalmology exams for early detection of glaucoma and other disorders of the eye. Would you like a referral to an eye doctor? No    Dental Screening: Recommended annual dental exams for proper oral hygiene  Community Resource Referral / Chronic Care Management: CRR required this visit?  No   CCM required this visit?  No   Plan:    I have personally reviewed and noted the following in the patient's chart:   Medical and social history Use of alcohol, tobacco or illicit drugs  Current medications and supplements including opioid prescriptions. Patient is not currently taking opioid prescriptions. Functional ability and status Nutritional status Physical activity Advanced directives List of other physicians Hospitalizations, surgeries, and ER visits in previous 12 months Vitals Screenings to include cognitive, depression, and falls Referrals and appointments  In addition, I have reviewed and discussed with patient certain preventive protocols, quality metrics, and best practice recommendations. A written personalized care plan for preventive services as well as general preventive health recommendations were provided to patient.   Rojelio LELON Blush, LPN   1/70/7974   After Visit Summary: (In Person-Printed) AVS printed and given to the patient  Notes: Nothing significant to report at this time.

## 2024-07-23 ENCOUNTER — Encounter: Payer: Self-pay | Admitting: Family Medicine

## 2024-07-23 ENCOUNTER — Ambulatory Visit: Payer: Self-pay

## 2024-07-23 ENCOUNTER — Ambulatory Visit (INDEPENDENT_AMBULATORY_CARE_PROVIDER_SITE_OTHER): Admitting: Family Medicine

## 2024-07-23 VITALS — BP 134/60 | HR 70 | Temp 97.8°F | Ht 62.0 in | Wt 115.0 lb

## 2024-07-23 DIAGNOSIS — N3001 Acute cystitis with hematuria: Secondary | ICD-10-CM | POA: Diagnosis not present

## 2024-07-23 DIAGNOSIS — R350 Frequency of micturition: Secondary | ICD-10-CM

## 2024-07-23 LAB — POCT URINALYSIS DIPSTICK
Bilirubin, UA: NEGATIVE
Blood, UA: POSITIVE
Glucose, UA: NEGATIVE
Ketones, UA: NEGATIVE
Nitrite, UA: NEGATIVE
Protein, UA: POSITIVE — AB
Spec Grav, UA: 1.01 (ref 1.010–1.025)
Urobilinogen, UA: NEGATIVE U/dL — AB
pH, UA: 6 (ref 5.0–8.0)

## 2024-07-23 MED ORDER — CEPHALEXIN 500 MG PO CAPS: 500.0000 mg | ORAL_CAPSULE | Freq: Three times a day (TID) | ORAL | 0 refills | Status: AC

## 2024-07-23 NOTE — Telephone Encounter (Signed)
 FYI Only or Action Required?: FYI only for provider.  Patient was last seen in primary care on 10/03/2023 by Merna Huxley, NP.  Called Nurse Triage reporting Urinary Frequency.  Symptoms began 2-3 days.  Interventions attempted: Nothing.  Symptoms are: unchanged.  Triage Disposition: See Physician Within 24 Hours  Patient/caregiver understands and will follow disposition?: Yes  **Appt. Scheduled for 9/12**                          Copied from CRM #[id number]. Topic: Clinical - Red Word Triage >> Jul 23, 2024  8:36 AM Vena HERO wrote: Red Word that prompted transfer to Nurse Triage: possible uti, frequent urinate and pain Reason for Disposition  Urinating more frequently than usual (i.e., frequency) OR new-onset of the feeling of an urgent need to urinate (i.e., urgency)  Answer Assessment - Initial Assessment Questions 1. SYMPTOM: What's the main symptom you're concerned about? (e.g., frequency, incontinence)     Frequency, pain   2. ONSET: When did the  symptoms  start?     X 2 -3 days  3. PAIN: Is there any pain? If Yes, ask: How bad is it? (Scale: 1-10; mild, moderate, severe)     Moderate pain with urination   4. CAUSE: What do you think is causing the symptoms?     Possible UTI   5. OTHER SYMPTOMS: Do you have any other symptoms? (e.g., blood in urine, fever, flank pain, pain with urination)  pain with urination  Protocols used: Urinary Symptoms-A-AH

## 2024-07-23 NOTE — Progress Notes (Signed)
   Acute Office Visit   Subjective:  Patient ID: Gabriela Gomez, female    DOB: [birth year], 88 y.o.   MRN: [id number]  Chief Complaint  Patient presents with   Urinary Frequency   Kiyra Slaubaugh presents to the clinic due to urinary hesitancy. Symptoms started 2 days ago. It hurts when she has to pee. She has noticed a little blood in her urine. Denies any nausea, fever, diarrhea, constipation, SOB. Denies any back or abdominal pain.  HPI  Review of Systems  Constitutional: Negative.   HENT: Negative.    Eyes: Negative.   Respiratory: Negative.    Cardiovascular: Negative.   Gastrointestinal: Negative.   Genitourinary:  Positive for dysuria and hematuria.       Hesitancy  Musculoskeletal: Negative.   Skin: Negative.   Neurological: Negative.   Psychiatric/Behavioral: Negative.        Objective:    BP 134/60   Pulse 70   Temp 97.8 F (36.6 C) (Oral)   Ht 5' 2 (1.575 m)   Wt 52.2 kg   SpO2 98%   BMI 21.03 kg/m    Physical Exam Constitutional:      Appearance: Normal appearance.  Cardiovascular:     Rate and Rhythm: Normal rate and regular rhythm.     Heart sounds: Normal heart sounds.  Pulmonary:     Effort: Pulmonary effort is normal.     Breath sounds: Normal breath sounds.  Abdominal:     General: Abdomen is flat. Bowel sounds are normal.     Palpations: Abdomen is soft.     Tenderness: There is no abdominal tenderness. There is no guarding.  Skin:    General: Skin is warm and dry.  Neurological:     General: No focal deficit present.     Mental Status: She is alert and oriented to person, place, and time. Mental status is at baseline.  Psychiatric:        Mood and Affect: Mood normal.        Behavior: Behavior normal.        Thought Content: Thought content normal.        Judgment: Judgment normal.      Assessment & Plan:  Urination frequency -     POCT urinalysis dipstick   -Urinalysis: leukocytes and blood noted. Sent for Culture. -Keflex  500 mg  TID x 7 days. -Make sure to hydrate well! -If symptoms persist or worsen please let the provider know immediately.  Morna Kipper, RN

## 2024-07-23 NOTE — Patient Instructions (Signed)
 It was nice to see you today! Today we: -Urinalysis: leukocytes and blood noted. Sent for Culture. -Ordered Keflex  500 mg TID x 7 days. -Make sure to hydrate well! If symptoms persist or worsen please let the provider know immediately.

## 2024-07-23 NOTE — Telephone Encounter (Signed)
 Noted

## 2024-07-24 LAB — URINE CULTURE
MICRO NUMBER:: 16961156
SPECIMEN QUALITY:: ADEQUATE

## 2024-07-26 ENCOUNTER — Ambulatory Visit: Payer: Self-pay | Admitting: Family Medicine

## 2024-08-06 DIAGNOSIS — H40013 Open angle with borderline findings, low risk, bilateral: Secondary | ICD-10-CM | POA: Diagnosis not present

## 2024-09-03 DIAGNOSIS — H40013 Open angle with borderline findings, low risk, bilateral: Secondary | ICD-10-CM | POA: Diagnosis not present

## 2024-09-07 ENCOUNTER — Encounter: Payer: Self-pay | Admitting: Adult Health

## 2024-09-08 NOTE — Telephone Encounter (Signed)
**Note De-identified  [place] Obfuscation** Please advise 

## 2024-09-30 DIAGNOSIS — L814 Other melanin hyperpigmentation: Secondary | ICD-10-CM | POA: Diagnosis not present

## 2024-09-30 DIAGNOSIS — L821 Other seborrheic keratosis: Secondary | ICD-10-CM | POA: Diagnosis not present

## 2024-09-30 DIAGNOSIS — C44519 Basal cell carcinoma of skin of other part of trunk: Secondary | ICD-10-CM | POA: Diagnosis not present

## 2024-09-30 DIAGNOSIS — D485 Neoplasm of uncertain behavior of skin: Secondary | ICD-10-CM | POA: Diagnosis not present

## 2024-09-30 DIAGNOSIS — D1801 Hemangioma of skin and subcutaneous tissue: Secondary | ICD-10-CM | POA: Diagnosis not present

## 2024-10-05 ENCOUNTER — Encounter: Payer: Self-pay | Admitting: Adult Health

## 2024-10-05 ENCOUNTER — Ambulatory Visit: Payer: Self-pay | Admitting: Adult Health

## 2024-10-05 ENCOUNTER — Ambulatory Visit (INDEPENDENT_AMBULATORY_CARE_PROVIDER_SITE_OTHER): Payer: Medicare Other | Admitting: Adult Health

## 2024-10-05 VITALS — BP 136/78 | HR 78 | Temp 98.0°F | Ht 62.0 in | Wt 111.4 lb

## 2024-10-05 DIAGNOSIS — Z0000 Encounter for general adult medical examination without abnormal findings: Secondary | ICD-10-CM | POA: Diagnosis not present

## 2024-10-05 DIAGNOSIS — I10 Essential (primary) hypertension: Secondary | ICD-10-CM

## 2024-10-05 DIAGNOSIS — Z85828 Personal history of other malignant neoplasm of skin: Secondary | ICD-10-CM

## 2024-10-05 DIAGNOSIS — E785 Hyperlipidemia, unspecified: Secondary | ICD-10-CM | POA: Diagnosis not present

## 2024-10-05 DIAGNOSIS — M85852 Other specified disorders of bone density and structure, left thigh: Secondary | ICD-10-CM | POA: Diagnosis not present

## 2024-10-05 DIAGNOSIS — K649 Unspecified hemorrhoids: Secondary | ICD-10-CM

## 2024-10-05 LAB — CBC WITH DIFFERENTIAL/PLATELET
Basophils Absolute: 0 K/uL (ref 0.0–0.1)
Basophils Relative: 0.9 % (ref 0.0–3.0)
Eosinophils Absolute: 0 K/uL (ref 0.0–0.7)
Eosinophils Relative: 0.9 % (ref 0.0–5.0)
HCT: 40.5 % (ref 36.0–46.0)
Hemoglobin: 13.5 g/dL (ref 12.0–15.0)
Lymphocytes Relative: 28.4 % (ref 12.0–46.0)
Lymphs Abs: 1.5 K/uL (ref 0.7–4.0)
MCHC: 33.3 g/dL (ref 30.0–36.0)
MCV: 91.6 fl (ref 78.0–100.0)
Monocytes Absolute: 0.3 K/uL (ref 0.1–1.0)
Monocytes Relative: 6.2 % (ref 3.0–12.0)
Neutro Abs: 3.4 K/uL (ref 1.4–7.7)
Neutrophils Relative %: 63.6 % (ref 43.0–77.0)
Platelets: 260 K/uL (ref 150.0–400.0)
RBC: 4.42 Mil/uL (ref 3.87–5.11)
RDW: 13.5 % (ref 11.5–15.5)
WBC: 5.4 K/uL (ref 4.0–10.5)

## 2024-10-05 LAB — COMPREHENSIVE METABOLIC PANEL WITH GFR
ALT: 17 U/L (ref 0–35)
AST: 23 U/L (ref 0–37)
Albumin: 4.2 g/dL (ref 3.5–5.2)
Alkaline Phosphatase: 74 U/L (ref 39–117)
BUN: 16 mg/dL (ref 6–23)
CO2: 32 meq/L (ref 19–32)
Calcium: 9.5 mg/dL (ref 8.4–10.5)
Chloride: 105 meq/L (ref 96–112)
Creatinine, Ser: 0.7 mg/dL (ref 0.40–1.20)
GFR: 78.4 mL/min (ref >60.00)
Glucose, Bld: 82 mg/dL (ref 70–99)
Potassium: 4 meq/L (ref 3.5–5.1)
Sodium: 143 meq/L (ref 135–145)
Total Bilirubin: 0.5 mg/dL (ref 0.2–1.2)
Total Protein: 7.1 g/dL (ref 6.0–8.3)

## 2024-10-05 LAB — LIPID PANEL
Cholesterol: 142 mg/dL (ref 0–200)
HDL: 62 mg/dL (ref >39.00)
LDL Cholesterol: 68 mg/dL (ref 0–99)
NonHDL: 79.51
Total CHOL/HDL Ratio: 2
Triglycerides: 58 mg/dL (ref 0.0–149.0)
VLDL: 11.6 mg/dL (ref 0.0–40.0)

## 2024-10-05 LAB — VITAMIN D 25 HYDROXY (VIT D DEFICIENCY, FRACTURES): VITD: 27.24 ng/mL — ABNORMAL LOW (ref 30.00–100.00)

## 2024-10-05 LAB — TSH: TSH: 2.67 u[IU]/mL (ref 0.35–5.50)

## 2024-10-05 MED ORDER — LISINOPRIL 5 MG PO TABS: 5.0000 mg | ORAL_TABLET | Freq: Every day | ORAL | 3 refills | Status: AC

## 2024-10-05 MED ORDER — ATORVASTATIN CALCIUM 20 MG PO TABS: 20.0000 mg | ORAL_TABLET | Freq: Every day | ORAL | 3 refills | Status: AC

## 2024-10-05 NOTE — Progress Notes (Signed)
 Subjective:    Patient ID: Gabriela Gomez, female    DOB: January 26, 1936, 88 y.o.   MRN: [id number]  HPI  Patient presents for yearly preventative medicine examination. She is a pleasant 88 year old female who  has a past medical history of Adenomatous colon polyp, Allergy, Arthritis, Basal cell carcinoma (2017), and Hyperlipidemia.  Essential  hypertension-managed with low-dose lisinopril  at 5 mg daily.  She does monitor her blood pressures at home and consistently has readings in the 120s to 130s over 80s.  She does have some elevation of her blood pressure when she comes to her medical appointments.  She denies dizziness, lightheadedness, chest pain, shortness of breath, or syncopal episodes. BP Readings from Last 3 Encounters:  10/05/24 136/78  07/23/24 134/60  07/09/24 120/60   Hyperlipidemia-managed with Lipitor 20 mg daily.  She denies myalgia or fatigue Lab Results  Component Value Date   CHOL 157 10/03/2023   HDL 55.60 10/03/2023   LDLCALC 89 10/03/2023   LDLDIRECT 139.1 08/19/2007   TRIG 58.0 10/03/2023   CHOLHDL 3 10/03/2023   History of skin cancer-is seen by dermatology on a routine basis. She was recently seen and had a bipsy removed from her right chest. She is waiting to hear back about the results.   Hemorrhoids - she will use Anusol  suppositories as needed.Last prescribed in 2021.   Osteopenia - she is overdue for bone density screen. Her last was done in 2020 and showed osteopenia with a T score of -1.1 in LFN  All immunizations and health maintenance protocols were reviewed with the patient and needed orders were placed. She is up to date on all vaccinations.   Appropriate screening laboratory values were ordered for the patient including screening of hyperlipidemia, renal function and hepatic function.   Medication reconciliation,  past medical history, social history, problem list and allergies were reviewed in detail with the patient  Goals were established with  regard to weight loss, exercise, and  diet in compliance with medications. She continues to go to the [facility] multiple times a week and eats healthy. Wt Readings from Last 3 Encounters:  10/05/24 111 lb 6.4 oz (50.5 kg)  07/23/24 115 lb (52.2 kg)  07/09/24 114 lb 4.8 oz (51.8 kg)   Review of Systems  Constitutional: Negative.   HENT: Negative.    Eyes: Negative.   Respiratory: Negative.    Cardiovascular: Negative.   Gastrointestinal: Negative.   Endocrine: Negative.   Genitourinary: Negative.   Musculoskeletal: Negative.   Skin: Negative.   Allergic/Immunologic: Negative.   Neurological: Negative.   Hematological: Negative.   Psychiatric/Behavioral: Negative.     Past Medical History:  Diagnosis Date   Adenomatous colon polyp    Allergy    Arthritis    Basal cell carcinoma 2017   Left upper lip & right [place]   Hyperlipidemia     Social History   Socioeconomic History   Marital status: Widowed    Spouse name: Not on file   Number of children: Not on file   Years of education: Not on file   Highest education level: Not on file  Occupational History   Not on file  Tobacco Use   Smoking status: Never   Smokeless tobacco: Never  Vaping Use   Vaping status: Never Used  Substance and Sexual Activity   Alcohol use: No    Alcohol/week: 0.0 standard drinks of alcohol   Drug use: No   Sexual activity: Not on file  Other Topics Concern   Not on file  Social History Narrative   Widowed   Lives with daughter, husband and three grandchildren    [facility] and [facility].    Two cats as pets     Social Drivers of Corporate Investment Banker Strain: Low Risk  (07/09/2024)   Overall Financial Resource Strain (CARDIA)    Difficulty of Paying Living Expenses: Not hard at all  Food Insecurity: No Food Insecurity (07/09/2024)   Hunger Vital Sign    Worried About Running Out of Food in the Last Year: Never true    Ran Out of Food in the Last Year: Never true   Transportation Needs: No Transportation Needs (07/09/2024)   PRAPARE - Administrator, Civil Service (Medical): No    Lack of Transportation (Non-Medical): No  Physical Activity: Sufficiently Active (07/09/2024)   Exercise Vital Sign    Days of Exercise per Week: 4 days    Minutes of Exercise per Session: 100 min  Stress: No Stress Concern Present (07/09/2024)   [employer] of Occupational Health - Occupational Stress Questionnaire    Feeling of Stress: Not at all  Social Connections: Moderately Integrated (07/09/2024)   Social Connection and Isolation Panel    Frequency of Communication with Friends and Family: More than three times a week    Frequency of Social Gatherings with Friends and Family: More than three times a week    Attends Religious Services: More than 4 times per year    Active Member of [employer] or Organizations: Yes    Attends Banker Meetings: More than 4 times per year    Marital Status: Widowed  Intimate Partner Violence: Not At Risk (07/09/2024)   Humiliation, Afraid, Rape, and Kick questionnaire    Fear of Current or Ex-Partner: No    Emotionally Abused: No    Physically Abused: No    Sexually Abused: No    Past Surgical History:  Procedure Laterality Date   APPENDECTOMY     CATARACT EXTRACTION, BILATERAL Bilateral 08/2021    Family History  Problem Relation Age of Onset   Throat cancer Mother    Diabetes Father    Heart disease Father    Heart attack Father    Colon cancer Neg Hx    Esophageal cancer Neg Hx    Rectal cancer Neg Hx    Stomach cancer Neg Hx    BRCA 1/2 Neg Hx    Breast cancer Neg Hx     No Known Allergies  Current Outpatient Medications on File Prior to Visit  Medication Sig Dispense Refill   Calcium  600-200 MG-UNIT tablet Take 1 tablet by mouth daily.     hydrocortisone  (ANUSOL -HC) 25 MG suppository Place 1 suppository (25 mg total) rectally 2 (two) times daily. 12 suppository 6   multivitamin  (THERAGRAN) per tablet Take 1 tablet by mouth daily.     psyllium (METAMUCIL) 58.6 % packet Take 1 packet by mouth as needed.     No current facility-administered medications on file prior to visit.    BP 136/78   Pulse 78   Temp 98 F (36.7 C)   Ht 5' 2 (1.575 m)   Wt 111 lb 6.4 oz (50.5 kg)   SpO2 97%   BMI 20.38 kg/m       Objective:   Physical Exam Vitals and nursing note reviewed.  Constitutional:      General: She is not  in acute distress.    Appearance: Normal appearance. She is not ill-appearing.  HENT:     Head: Normocephalic and atraumatic.     Right Ear: Tympanic membrane, ear canal and external ear normal. There is no impacted cerumen.     Left Ear: Tympanic membrane, ear canal and external ear normal. There is no impacted cerumen.     Nose: Nose normal. No congestion or rhinorrhea.     Mouth/Throat:     Mouth: Mucous membranes are moist.     Pharynx: Oropharynx is clear.  Eyes:     Extraocular Movements: Extraocular movements intact.     Conjunctiva/sclera: Conjunctivae normal.     Pupils: Pupils are equal, round, and reactive to light.  Neck:     Vascular: No carotid bruit.  Cardiovascular:     Rate and Rhythm: Normal rate and regular rhythm.     Pulses: Normal pulses.     Heart sounds: No murmur heard.    No friction rub. No gallop.  Pulmonary:     Effort: Pulmonary effort is normal.     Breath sounds: Normal breath sounds.  Abdominal:     General: Abdomen is flat. Bowel sounds are normal. There is no distension.     Palpations: Abdomen is soft. There is no mass.     Tenderness: There is no abdominal tenderness. There is no guarding or rebound.     Hernia: No hernia is present.  Musculoskeletal:        General: Normal range of motion.     Cervical back: Normal range of motion and neck supple.  Lymphadenopathy:     Cervical: No cervical adenopathy.  Skin:    General: Skin is warm and dry.     Capillary Refill: Capillary refill takes less than 2  seconds.  Neurological:     General: No focal deficit present.     Mental Status: She is alert and oriented to person, place, and time.  Psychiatric:        Mood and Affect: Mood normal.        Behavior: Behavior normal.        Thought Content: Thought content normal.        Judgment: Judgment normal.         Assessment & Plan:   1. Routine general medical examination at a health care facility (Primary) Today patient counseled on age appropriate routine health concerns for screening and prevention, each reviewed and up to date or declined. Immunizations reviewed and up to date or declined. Labs ordered and reviewed. Risk factors for depression reviewed and negative. Hearing function and visual acuity are intact. ADLs screened and addressed as needed. Functional ability and level of safety reviewed and appropriate. Education, counseling and referrals performed based on assessed risks today. Patient provided with a copy of personalized plan for preventive services. - Eat healthy and exercise - Follow up in one year or sooner if needed   2. Essential hypertension - Well controlled. No change in medication  - CBC with Differential/Platelet; Future - Comprehensive metabolic panel with GFR; Future - Lipid panel; Future - TSH; Future - lisinopril  (ZESTRIL ) 5 MG tablet; Take 1 tablet (5 mg total) by mouth daily.  Dispense: 90 tablet; Refill: 3  3. Hyperlipidemia, unspecified hyperlipidemia type - Continue with Lipitor 20 mg daily  - CBC with Differential/Platelet; Future - Comprehensive metabolic panel with GFR; Future - Lipid panel; Future - TSH; Future - atorvastatin  (LIPITOR) 20 MG tablet; Take 1 tablet (  20 mg total) by mouth daily.  Dispense: 90 tablet; Refill: 3  4. History of skin cancer - Per dermatology   5. Osteopenia of neck of left femur  - DG Bone Density; Future - Vitamin D , 25-hydroxy; Future   Darleene Shape, NP

## 2024-10-05 NOTE — Patient Instructions (Signed)
It was great seeing you today   We will follow up with you regarding your lab work   Please let me know if you need anything   Schedule your bone density test at check out desk. You may also call directly to X-ray at [phone] to schedule an appointment that is convenient for you.  - located [address] across the street from  - in the basement - you do need an appointment for the bone density tests.  

## 2024-10-19 ENCOUNTER — Inpatient Hospital Stay: Admission: RE | Admit: 2024-10-19 | Discharge: 2024-10-19 | Attending: Adult Health

## 2024-10-19 DIAGNOSIS — M85852 Other specified disorders of bone density and structure, left thigh: Secondary | ICD-10-CM

## 2025-07-15 ENCOUNTER — Ambulatory Visit
